# Patient Record
Sex: Male | Born: 1945
Health system: Southern US, Community
[De-identification: ages and names within clinical notes are randomized; demographics above are authoritative.]

## PROBLEM LIST (undated history)

## (undated) DIAGNOSIS — M1990 Unspecified osteoarthritis, unspecified site: Secondary | ICD-10-CM

## (undated) DIAGNOSIS — E669 Obesity, unspecified: Secondary | ICD-10-CM

## (undated) DIAGNOSIS — Z950 Presence of cardiac pacemaker: Secondary | ICD-10-CM

## (undated) DIAGNOSIS — J986 Disorders of diaphragm: Secondary | ICD-10-CM

## (undated) DIAGNOSIS — J45909 Unspecified asthma, uncomplicated: Secondary | ICD-10-CM

## (undated) DIAGNOSIS — J189 Pneumonia, unspecified organism: Secondary | ICD-10-CM

## (undated) DIAGNOSIS — R972 Elevated prostate specific antigen [PSA]: Secondary | ICD-10-CM

## (undated) DIAGNOSIS — I10 Essential (primary) hypertension: Secondary | ICD-10-CM

## (undated) DIAGNOSIS — W3400XA Accidental discharge from unspecified firearms or gun, initial encounter: Secondary | ICD-10-CM

## (undated) DIAGNOSIS — I2510 Atherosclerotic heart disease of native coronary artery without angina pectoris: Secondary | ICD-10-CM

## (undated) DIAGNOSIS — H9311 Tinnitus, right ear: Secondary | ICD-10-CM

## (undated) DIAGNOSIS — S21339A Puncture wound without foreign body of unspecified front wall of thorax with penetration into thoracic cavity, initial encounter: Secondary | ICD-10-CM

## (undated) DIAGNOSIS — N182 Chronic kidney disease, stage 2 (mild): Secondary | ICD-10-CM

## (undated) DIAGNOSIS — L300 Nummular dermatitis: Secondary | ICD-10-CM

## (undated) DIAGNOSIS — H9190 Unspecified hearing loss, unspecified ear: Secondary | ICD-10-CM

## (undated) DIAGNOSIS — A770 Spotted fever due to Rickettsia rickettsii: Secondary | ICD-10-CM

## (undated) DIAGNOSIS — L719 Rosacea, unspecified: Secondary | ICD-10-CM

## (undated) DIAGNOSIS — S32009A Unspecified fracture of unspecified lumbar vertebra, initial encounter for closed fracture: Secondary | ICD-10-CM

## (undated) HISTORY — DX: Rosacea, unspecified: L71.9

## (undated) HISTORY — PX: TENDON REPAIR: SHX5111

## (undated) HISTORY — DX: Unspecified hearing loss, unspecified ear: H91.90

## (undated) HISTORY — DX: Obesity, unspecified: E66.9

## (undated) HISTORY — DX: Elevated prostate specific antigen (PSA): R97.20

## (undated) HISTORY — DX: Nummular dermatitis: L30.0

## (undated) HISTORY — DX: Disorders of diaphragm: J98.6

---

## 1983-03-29 DIAGNOSIS — S21339A Puncture wound without foreign body of unspecified front wall of thorax with penetration into thoracic cavity, initial encounter: Secondary | ICD-10-CM

## 1983-03-29 HISTORY — DX: Puncture wound without foreign body of unspecified front wall of thorax with penetration into thoracic cavity, initial encounter: S21.339A

## 2002-12-27 ENCOUNTER — Emergency Department (HOSPITAL_COMMUNITY): Admission: EM | Admit: 2002-12-27 | Discharge: 2002-12-27 | Payer: Self-pay | Admitting: Emergency Medicine

## 2002-12-27 ENCOUNTER — Encounter: Payer: Self-pay | Admitting: *Deleted

## 2008-04-16 ENCOUNTER — Encounter: Admission: RE | Admit: 2008-04-16 | Discharge: 2008-04-16 | Payer: Self-pay | Admitting: Geriatric Medicine

## 2010-04-18 ENCOUNTER — Encounter: Payer: Self-pay | Admitting: Geriatric Medicine

## 2010-06-08 ENCOUNTER — Emergency Department (HOSPITAL_COMMUNITY): Payer: BC Managed Care – PPO

## 2010-06-08 ENCOUNTER — Emergency Department (HOSPITAL_COMMUNITY)
Admission: EM | Admit: 2010-06-08 | Discharge: 2010-06-09 | Disposition: A | Discharge status: Home / Self Care | Payer: BC Managed Care – PPO | Attending: Emergency Medicine | Admitting: Emergency Medicine

## 2010-06-08 DIAGNOSIS — S298XXA Other specified injuries of thorax, initial encounter: Secondary | ICD-10-CM | POA: Insufficient documentation

## 2010-06-08 DIAGNOSIS — R0609 Other forms of dyspnea: Secondary | ICD-10-CM | POA: Insufficient documentation

## 2010-06-08 DIAGNOSIS — R079 Chest pain, unspecified: Secondary | ICD-10-CM | POA: Insufficient documentation

## 2010-06-08 DIAGNOSIS — R0989 Other specified symptoms and signs involving the circulatory and respiratory systems: Secondary | ICD-10-CM | POA: Insufficient documentation

## 2010-06-08 DIAGNOSIS — R062 Wheezing: Secondary | ICD-10-CM | POA: Insufficient documentation

## 2010-06-08 DIAGNOSIS — R509 Fever, unspecified: Secondary | ICD-10-CM | POA: Insufficient documentation

## 2010-06-08 DIAGNOSIS — I10 Essential (primary) hypertension: Secondary | ICD-10-CM | POA: Insufficient documentation

## 2010-06-08 DIAGNOSIS — Z79899 Other long term (current) drug therapy: Secondary | ICD-10-CM | POA: Insufficient documentation

## 2010-06-08 DIAGNOSIS — R0602 Shortness of breath: Secondary | ICD-10-CM | POA: Insufficient documentation

## 2010-06-08 DIAGNOSIS — J189 Pneumonia, unspecified organism: Secondary | ICD-10-CM | POA: Insufficient documentation

## 2010-06-08 DIAGNOSIS — Y9229 Other specified public building as the place of occurrence of the external cause: Secondary | ICD-10-CM | POA: Insufficient documentation

## 2010-06-08 DIAGNOSIS — R42 Dizziness and giddiness: Secondary | ICD-10-CM | POA: Insufficient documentation

## 2010-06-08 DIAGNOSIS — R000 Tachycardia, unspecified: Secondary | ICD-10-CM | POA: Insufficient documentation

## 2010-06-08 LAB — COMPREHENSIVE METABOLIC PANEL
ALT: 33 U/L (ref 0–53)
AST: 23 U/L (ref 0–37)
Albumin: 3.9 g/dL (ref 3.5–5.2)
Alkaline Phosphatase: 52 U/L (ref 39–117)
BUN: 14 mg/dL (ref 6–23)
CO2: 23 mEq/L (ref 19–32)
Calcium: 8.7 mg/dL (ref 8.4–10.5)
Chloride: 106 mEq/L (ref 96–112)
Creatinine, Ser: 1.02 mg/dL (ref 0.4–1.5)
GFR calc Af Amer: >60 mL/min (ref >60)
GFR calc non Af Amer: >60 mL/min (ref >60)
Glucose, Bld: 115 mg/dL — ABNORMAL HIGH (ref 70–99)
Potassium: 3.9 mEq/L (ref 3.5–5.1)
Sodium: 138 mEq/L (ref 135–145)
Total Bilirubin: 1.2 mg/dL (ref 0.3–1.2)
Total Protein: 7.3 g/dL (ref 6.0–8.3)

## 2010-06-08 LAB — DIFFERENTIAL
Basophils Absolute: 0 10*3/uL (ref 0.0–0.1)
Basophils Relative: 0 % (ref 0–1)
Eosinophils Absolute: 0 10*3/uL (ref 0.0–0.7)
Eosinophils Relative: 0 % (ref 0–5)
Lymphocytes Relative: 11 % — ABNORMAL LOW (ref 12–46)
Lymphs Abs: 1.5 10*3/uL (ref 0.7–4.0)
Monocytes Absolute: 1.3 10*3/uL — ABNORMAL HIGH (ref 0.1–1.0)
Monocytes Relative: 10 % (ref 3–12)
Neutro Abs: 11 10*3/uL — ABNORMAL HIGH (ref 1.7–7.7)
Neutrophils Relative %: 80 % — ABNORMAL HIGH (ref 43–77)

## 2010-06-08 LAB — POCT CARDIAC MARKERS
CKMB, poc: <1 ng/mL — ABNORMAL LOW (ref 1.0–8.0)
Myoglobin, poc: 94.5 ng/mL (ref 12–200)
Troponin i, poc: <0.05 ng/mL (ref 0.00–0.09)

## 2010-06-08 LAB — CBC
HCT: 41.2 % (ref 39.0–52.0)
Hemoglobin: 14.1 g/dL (ref 13.0–17.0)
MCH: 30.4 pg (ref 26.0–34.0)
MCHC: 34.2 g/dL (ref 30.0–36.0)
MCV: 88.8 fL (ref 78.0–100.0)
Platelets: 197 10*3/uL (ref 150–400)
RBC: 4.64 MIL/uL (ref 4.22–5.81)
RDW: 13.3 % (ref 11.5–15.5)
WBC: 13.8 10*3/uL — ABNORMAL HIGH (ref 4.0–10.5)

## 2010-06-08 LAB — D-DIMER, QUANTITATIVE: D-Dimer, Quant: 0.33 ug/mL-FEU (ref 0.00–0.48)

## 2010-06-09 MED ORDER — IOHEXOL 300 MG/ML  SOLN
100.0000 mL | Freq: Once | INTRAMUSCULAR | Status: AC | PRN
  Administered 2010-06-09: 100 mL via INTRAVENOUS

## 2013-01-08 ENCOUNTER — Other Ambulatory Visit: Payer: Self-pay | Admitting: Geriatric Medicine

## 2013-01-08 ENCOUNTER — Ambulatory Visit
Admission: RE | Admit: 2013-01-08 | Discharge: 2013-01-08 | Disposition: A | Discharge status: Home / Self Care | Payer: Medicare Other | Source: Ambulatory Visit | Attending: Geriatric Medicine | Admitting: Geriatric Medicine

## 2013-01-08 DIAGNOSIS — M549 Dorsalgia, unspecified: Secondary | ICD-10-CM

## 2013-09-18 ENCOUNTER — Encounter: Payer: Self-pay | Admitting: Cardiology

## 2013-09-18 DIAGNOSIS — I10 Essential (primary) hypertension: Secondary | ICD-10-CM | POA: Insufficient documentation

## 2013-09-18 DIAGNOSIS — H6990 Unspecified Eustachian tube disorder, unspecified ear: Secondary | ICD-10-CM

## 2013-09-24 ENCOUNTER — Other Ambulatory Visit (HOSPITAL_COMMUNITY): Payer: Medicare Other

## 2013-11-14 ENCOUNTER — Other Ambulatory Visit (HOSPITAL_COMMUNITY): Payer: Self-pay | Admitting: Geriatric Medicine

## 2013-11-14 DIAGNOSIS — I4510 Unspecified right bundle-branch block: Secondary | ICD-10-CM

## 2013-11-15 ENCOUNTER — Other Ambulatory Visit (HOSPITAL_COMMUNITY): Payer: Medicare Other

## 2013-11-18 ENCOUNTER — Encounter (HOSPITAL_COMMUNITY): Payer: Self-pay | Admitting: Geriatric Medicine

## 2015-07-01 DIAGNOSIS — R05 Cough: Secondary | ICD-10-CM | POA: Diagnosis not present

## 2015-07-02 ENCOUNTER — Other Ambulatory Visit: Payer: Self-pay | Admitting: Geriatric Medicine

## 2015-07-02 ENCOUNTER — Ambulatory Visit
Admission: RE | Admit: 2015-07-02 | Discharge: 2015-07-02 | Disposition: A | Discharge status: Home / Self Care | Payer: Medicare Other | Source: Ambulatory Visit | Attending: Geriatric Medicine | Admitting: Geriatric Medicine

## 2015-07-02 DIAGNOSIS — R0602 Shortness of breath: Secondary | ICD-10-CM | POA: Diagnosis not present

## 2015-07-02 DIAGNOSIS — R059 Cough, unspecified: Secondary | ICD-10-CM

## 2015-07-02 DIAGNOSIS — R05 Cough: Secondary | ICD-10-CM

## 2015-08-30 DIAGNOSIS — I10 Essential (primary) hypertension: Secondary | ICD-10-CM | POA: Diagnosis not present

## 2015-08-30 DIAGNOSIS — R0600 Dyspnea, unspecified: Secondary | ICD-10-CM | POA: Diagnosis not present

## 2016-03-02 DIAGNOSIS — Z125 Encounter for screening for malignant neoplasm of prostate: Secondary | ICD-10-CM | POA: Diagnosis not present

## 2016-03-02 DIAGNOSIS — Z1389 Encounter for screening for other disorder: Secondary | ICD-10-CM | POA: Diagnosis not present

## 2016-03-02 DIAGNOSIS — R05 Cough: Secondary | ICD-10-CM | POA: Diagnosis not present

## 2016-03-02 DIAGNOSIS — I10 Essential (primary) hypertension: Secondary | ICD-10-CM | POA: Diagnosis not present

## 2016-03-02 DIAGNOSIS — Z6833 Body mass index (BMI) 33.0-33.9, adult: Secondary | ICD-10-CM | POA: Diagnosis not present

## 2016-03-02 DIAGNOSIS — Z79899 Other long term (current) drug therapy: Secondary | ICD-10-CM | POA: Diagnosis not present

## 2016-03-02 DIAGNOSIS — E669 Obesity, unspecified: Secondary | ICD-10-CM | POA: Diagnosis not present

## 2016-03-02 DIAGNOSIS — Z0000 Encounter for general adult medical examination without abnormal findings: Secondary | ICD-10-CM | POA: Diagnosis not present

## 2016-04-23 DIAGNOSIS — H40033 Anatomical narrow angle, bilateral: Secondary | ICD-10-CM | POA: Diagnosis not present

## 2016-04-23 DIAGNOSIS — H2513 Age-related nuclear cataract, bilateral: Secondary | ICD-10-CM | POA: Diagnosis not present

## 2016-04-29 ENCOUNTER — Ambulatory Visit
Admission: RE | Admit: 2016-04-29 | Discharge: 2016-04-29 | Disposition: A | Discharge status: Home / Self Care | Payer: Medicare Other | Source: Ambulatory Visit | Attending: Nurse Practitioner | Admitting: Nurse Practitioner

## 2016-04-29 ENCOUNTER — Other Ambulatory Visit: Payer: Self-pay | Admitting: Nurse Practitioner

## 2016-04-29 DIAGNOSIS — R509 Fever, unspecified: Secondary | ICD-10-CM

## 2016-04-29 DIAGNOSIS — R05 Cough: Secondary | ICD-10-CM | POA: Diagnosis not present

## 2016-04-29 DIAGNOSIS — R062 Wheezing: Secondary | ICD-10-CM | POA: Diagnosis not present

## 2016-04-29 DIAGNOSIS — R0989 Other specified symptoms and signs involving the circulatory and respiratory systems: Secondary | ICD-10-CM | POA: Diagnosis not present

## 2016-05-09 DIAGNOSIS — I10 Essential (primary) hypertension: Secondary | ICD-10-CM | POA: Diagnosis not present

## 2016-05-09 DIAGNOSIS — R0982 Postnasal drip: Secondary | ICD-10-CM | POA: Diagnosis not present

## 2016-09-07 DIAGNOSIS — G4730 Sleep apnea, unspecified: Secondary | ICD-10-CM | POA: Diagnosis not present

## 2016-09-07 DIAGNOSIS — Z79899 Other long term (current) drug therapy: Secondary | ICD-10-CM | POA: Diagnosis not present

## 2016-09-07 DIAGNOSIS — I10 Essential (primary) hypertension: Secondary | ICD-10-CM | POA: Diagnosis not present

## 2016-09-07 DIAGNOSIS — E669 Obesity, unspecified: Secondary | ICD-10-CM | POA: Diagnosis not present

## 2016-11-08 DIAGNOSIS — D225 Melanocytic nevi of trunk: Secondary | ICD-10-CM | POA: Diagnosis not present

## 2016-11-08 DIAGNOSIS — L821 Other seborrheic keratosis: Secondary | ICD-10-CM | POA: Diagnosis not present

## 2016-11-08 DIAGNOSIS — B354 Tinea corporis: Secondary | ICD-10-CM | POA: Diagnosis not present

## 2016-12-20 DIAGNOSIS — H18413 Arcus senilis, bilateral: Secondary | ICD-10-CM | POA: Diagnosis not present

## 2016-12-20 DIAGNOSIS — H25013 Cortical age-related cataract, bilateral: Secondary | ICD-10-CM | POA: Diagnosis not present

## 2016-12-20 DIAGNOSIS — H2513 Age-related nuclear cataract, bilateral: Secondary | ICD-10-CM | POA: Diagnosis not present

## 2016-12-20 DIAGNOSIS — H25043 Posterior subcapsular polar age-related cataract, bilateral: Secondary | ICD-10-CM | POA: Diagnosis not present

## 2017-01-05 DIAGNOSIS — L309 Dermatitis, unspecified: Secondary | ICD-10-CM | POA: Diagnosis not present

## 2017-03-09 DIAGNOSIS — Z0000 Encounter for general adult medical examination without abnormal findings: Secondary | ICD-10-CM | POA: Diagnosis not present

## 2017-03-09 DIAGNOSIS — N183 Chronic kidney disease, stage 3 (moderate): Secondary | ICD-10-CM | POA: Diagnosis not present

## 2017-03-09 DIAGNOSIS — G629 Polyneuropathy, unspecified: Secondary | ICD-10-CM | POA: Diagnosis not present

## 2017-03-09 DIAGNOSIS — E782 Mixed hyperlipidemia: Secondary | ICD-10-CM | POA: Diagnosis not present

## 2017-03-10 ENCOUNTER — Other Ambulatory Visit: Payer: Self-pay | Admitting: Geriatric Medicine

## 2017-03-10 DIAGNOSIS — R0600 Dyspnea, unspecified: Secondary | ICD-10-CM

## 2017-03-10 DIAGNOSIS — R0609 Other forms of dyspnea: Principal | ICD-10-CM

## 2017-03-14 ENCOUNTER — Encounter (HOSPITAL_COMMUNITY): Payer: Medicare Other

## 2017-03-15 ENCOUNTER — Ambulatory Visit
Admission: RE | Admit: 2017-03-15 | Discharge: 2017-03-15 | Disposition: A | Discharge status: Home / Self Care | Payer: Medicare Other | Source: Ambulatory Visit | Attending: Geriatric Medicine | Admitting: Geriatric Medicine

## 2017-03-15 ENCOUNTER — Other Ambulatory Visit: Payer: Self-pay | Admitting: Geriatric Medicine

## 2017-03-15 DIAGNOSIS — R0600 Dyspnea, unspecified: Secondary | ICD-10-CM

## 2017-03-15 DIAGNOSIS — R05 Cough: Secondary | ICD-10-CM | POA: Diagnosis not present

## 2017-03-15 DIAGNOSIS — R0609 Other forms of dyspnea: Principal | ICD-10-CM

## 2017-03-30 ENCOUNTER — Encounter (HOSPITAL_COMMUNITY): Payer: Medicare Other

## 2017-04-07 ENCOUNTER — Telehealth (HOSPITAL_COMMUNITY): Payer: Self-pay | Admitting: Geriatric Medicine

## 2017-04-07 NOTE — Telephone Encounter (Signed)
User: Trina AoGriffin, [name] A Date/time: 04/04/2017 10:17 AM  Comment: Called pt and lmsg for him to CB ro r/s [email or web address]  Context: [place] Schedule Orders/Appt Requests Outcome: Left Message  Phone number: [phone] Phone Type: Home Phone  Comm. type: Telephone Call type: Outgoing  Contact: Brian [name], Brian [name] Relation to patient: Self  Letter:      User: Trina AoGriffin, [name] A Date/time: 03/31/2017 11:45 AM  Comment: Called pt and lmsg for him to CB to r/s [email or web address]  Context: [place] Schedule Orders/Appt Requests Outcome: Left Message  Phone number: [phone] Phone Type: Home Phone  Comm. type: Telephone Call type: Outgoing  Contact: Brian [name], Brian [name] Relation to patient: Self  Letter:      User: Trina AoGriffin, [name] A Date/time: 03/27/2017 2:25 PM  Comment: Called pt and lmsg for him to CB to r/s [email or web address]  Context: [place] Schedule Orders/Appt Requests Outcome: Left Message  Phone number: [phone] Phone Type: Home Phone  Comm. type: Telephone Call type: Outgoing  Contact: Brian [name], Brian [name] Relation to patient: Self  Letter:

## 2017-04-19 DIAGNOSIS — D3131 Benign neoplasm of right choroid: Secondary | ICD-10-CM | POA: Diagnosis not present

## 2017-04-19 DIAGNOSIS — H353133 Nonexudative age-related macular degeneration, bilateral, advanced atrophic without subfoveal involvement: Secondary | ICD-10-CM | POA: Diagnosis not present

## 2017-04-19 DIAGNOSIS — H43813 Vitreous degeneration, bilateral: Secondary | ICD-10-CM | POA: Diagnosis not present

## 2017-04-19 DIAGNOSIS — H2513 Age-related nuclear cataract, bilateral: Secondary | ICD-10-CM | POA: Diagnosis not present

## 2017-06-14 DIAGNOSIS — Z1211 Encounter for screening for malignant neoplasm of colon: Secondary | ICD-10-CM | POA: Diagnosis not present

## 2017-06-20 DIAGNOSIS — R972 Elevated prostate specific antigen [PSA]: Secondary | ICD-10-CM | POA: Diagnosis not present

## 2017-06-20 DIAGNOSIS — R351 Nocturia: Secondary | ICD-10-CM | POA: Diagnosis not present

## 2017-07-18 DIAGNOSIS — H25013 Cortical age-related cataract, bilateral: Secondary | ICD-10-CM | POA: Diagnosis not present

## 2017-07-18 DIAGNOSIS — H25043 Posterior subcapsular polar age-related cataract, bilateral: Secondary | ICD-10-CM | POA: Diagnosis not present

## 2017-07-18 DIAGNOSIS — H2513 Age-related nuclear cataract, bilateral: Secondary | ICD-10-CM | POA: Diagnosis not present

## 2017-07-18 DIAGNOSIS — H18413 Arcus senilis, bilateral: Secondary | ICD-10-CM | POA: Diagnosis not present

## 2017-09-07 ENCOUNTER — Encounter: Payer: Self-pay | Admitting: Internal Medicine

## 2017-09-07 ENCOUNTER — Other Ambulatory Visit: Payer: Self-pay

## 2017-09-07 ENCOUNTER — Encounter (INDEPENDENT_AMBULATORY_CARE_PROVIDER_SITE_OTHER): Payer: Self-pay

## 2017-09-07 ENCOUNTER — Telehealth: Payer: Self-pay | Admitting: Cardiology

## 2017-09-07 ENCOUNTER — Ambulatory Visit (INDEPENDENT_AMBULATORY_CARE_PROVIDER_SITE_OTHER): Payer: Medicare Other | Admitting: Internal Medicine

## 2017-09-07 ENCOUNTER — Ambulatory Visit (HOSPITAL_COMMUNITY): Payer: Medicare Other | Attending: Cardiology

## 2017-09-07 VITALS — BP 160/50 | HR 37 | Ht 70.0 in | Wt 216.0 lb

## 2017-09-07 DIAGNOSIS — I119 Hypertensive heart disease without heart failure: Secondary | ICD-10-CM | POA: Insufficient documentation

## 2017-09-07 DIAGNOSIS — E669 Obesity, unspecified: Secondary | ICD-10-CM | POA: Diagnosis not present

## 2017-09-07 DIAGNOSIS — Z6831 Body mass index (BMI) 31.0-31.9, adult: Secondary | ICD-10-CM | POA: Diagnosis not present

## 2017-09-07 DIAGNOSIS — R001 Bradycardia, unspecified: Secondary | ICD-10-CM | POA: Diagnosis not present

## 2017-09-07 DIAGNOSIS — I441 Atrioventricular block, second degree: Secondary | ICD-10-CM

## 2017-09-07 DIAGNOSIS — E785 Hyperlipidemia, unspecified: Secondary | ICD-10-CM | POA: Insufficient documentation

## 2017-09-07 DIAGNOSIS — I129 Hypertensive chronic kidney disease with stage 1 through stage 4 chronic kidney disease, or unspecified chronic kidney disease: Secondary | ICD-10-CM | POA: Diagnosis not present

## 2017-09-07 DIAGNOSIS — B372 Candidiasis of skin and nail: Secondary | ICD-10-CM | POA: Diagnosis not present

## 2017-09-07 DIAGNOSIS — N183 Chronic kidney disease, stage 3 (moderate): Secondary | ICD-10-CM | POA: Diagnosis not present

## 2017-09-07 LAB — ECHOCARDIOGRAM COMPLETE
Height: 70 in
Weight: 3456 oz

## 2017-09-07 NOTE — Patient Instructions (Signed)
Medication Instructions:  Your physician recommends that you continue on your current medications as directed. Please refer to the Current Medication list given to you today.  Labwork: You will get lab work today:  BMP and CBC.  Testing/Procedures: Your physician has recommended that you have a pacemaker inserted. A pacemaker is a small device that is placed under the skin of your chest or abdomen to help control abnormal heart rhythms. This device uses electrical pulses to prompt the heart to beat at a normal rate. Pacemakers are used to treat heart rhythms that are too slow. Wire (leads) are attached to the pacemaker that goes into the chambers of you heart. This is done in the hospital and usually requires and overnight stay. Please see the instruction sheet given to you today for more information.  Follow-Up: You will follow up with device clinic 10-14 days after your procedure for a wound check.  You will follow up with Dr. Ladona Ridgelaylor 91 days after your procedure.  Any Other Special Instructions Will Be Listed Below (If Applicable).  PACEMAKER INSTRUCTIONS:  [facility] at:  1:30 pm on 09/07/2017 You may have a light breakfast before 7:30 am.  No solid food after 7:30 am.  You may have small sips of water for dry mouth. You may take your normal morning medications day of procedure except for :  Lasix, spironolactone or aspirin. Plan for one night stay You will need someone to drive you home at discharge  If you need a refill on your cardiac medications before your next appointment, please call your pharmacy.   Pacemaker Implantation, Adult Pacemaker implantation is a procedure to place a pacemaker inside your chest. A pacemaker is a small computer that sends electrical signals to the heart and helps your heart beat normally. A pacemaker also stores information about your heart rhythms. You may need pacemaker implantation if  you:  Have a slow heartbeat (bradycardia).  Faint (syncope).  Have shortness of breath (dyspnea) due to heart problems.  The pacemaker attaches to your heart through a wire, called a lead. Sometimes just one lead is needed. Other times, there will be two leads. There are two types of pacemakers:  Transvenous pacemaker. This type is placed under the skin or muscle of your chest. The lead goes through a vein in the chest area to reach the inside of the heart.  Epicardial pacemaker. This type is placed under the skin or muscle of your chest or belly. The lead goes through your chest to the outside of the heart.  Tell a health care provider about:  Any allergies you have.  All medicines you are taking, including vitamins, herbs, eye drops, creams, and over-the-counter medicines.  Any problems you or family members have had with anesthetic medicines.  Any blood or bone disorders you have.  Any surgeries you have had.  Any medical conditions you have.  Whether you are pregnant or may be pregnant. What are the risks? Generally, this is a safe procedure. However, problems may occur, including:  Infection.  Bleeding.  Failure of the pacemaker or the lead.  Collapse of a lung or bleeding into a lung.  Blood clot inside a blood vessel with a lead.  Damage to the heart.  Infection inside the heart (endocarditis).  Allergic reactions to medicines.  What happens before the procedure? Staying hydrated Follow instructions from your health care provider about hydration, which may include:  Up to 2  hours before the procedure - you may continue to drink clear liquids, such as water, clear fruit juice, black coffee, and plain tea.  Eating and drinking restrictions Follow instructions from your health care provider about eating and drinking, which may include:  8 hours before the procedure - stop eating heavy meals or foods such as meat, fried foods, or fatty foods.  6 hours  before the procedure - stop eating light meals or foods, such as toast or cereal.  6 hours before the procedure - stop drinking milk or drinks that contain milk.  2 hours before the procedure - stop drinking clear liquids.  Medicines  Ask your health care provider about: ? Changing or stopping your regular medicines. This is especially important if you are taking diabetes medicines or blood thinners. ? Taking medicines such as aspirin and ibuprofen. These medicines can thin your blood. Do not take these medicines before your procedure if your health care provider instructs you not to.  You may be given antibiotic medicine to help prevent infection. General instructions  You will have a heart evaluation. This may include an electrocardiogram (ECG), chest X-ray, and heart imaging (echocardiogram,  or echo) tests.  You will have blood tests.  Do not use any products that contain nicotine or tobacco, such as cigarettes and e-cigarettes. If you need help quitting, ask your health care provider.  Plan to have someone take you home from the hospital or clinic.  If you will be going home right after the procedure, plan to have someone with you for 24 hours.  Ask your health care provider how your surgical site will be marked or identified. What happens during the procedure?  To reduce your risk of infection: ? Your health care team will wash or sanitize their hands. ? Your skin will be washed with soap. ? Hair may be removed from the surgical area.  An IV tube will be inserted into one of your veins.  You will be given one or more of the following: ? A medicine to help you relax (sedative). ? A medicine to numb the area (local anesthetic). ? A medicine to make you fall asleep (general anesthetic).  If you are getting a transvenous pacemaker: ? An incision will be made in your upper chest. ? A pocket will be made for the pacemaker. It may be placed under the skin or between layers of  muscle. ? The lead will be inserted into a blood vessel that returns to the heart. ? While X-rays are taken by an imaging machine (fluoroscopy), the lead will be advanced through the vein to the inside of your heart. ? The other end of the lead will be tunneled under the skin and attached to the pacemaker.  If you are getting an epicardial pacemaker: ? An incision will be made near your ribs or breastbone (sternum) for the lead. ? The lead will be attached to the outside of your heart. ? Another incision will be made in your chest or upper belly to create a pocket for the pacemaker. ? The free end of the lead will be tunneled under the skin and attached to the pacemaker.  The transvenous or epicardial pacemaker will be tested. Imaging studies may be done to check the lead position.  The incisions will be closed with stitches (sutures), adhesive strips, or skin glue.  Bandages (dressing) will be placed over the incisions. The procedure may vary among health care providers and hospitals. What happens after the procedure?  Your blood pressure, heart rate, breathing rate, and blood oxygen level will be monitored until the medicines you were given have worn off.  You will be given antibiotics and pain medicine.  ECG and chest x-rays will be done.  You will wear a continuous type of ECG (Holter monitor) to check your heart rhythm.  Your health care provider willprogram the pacemaker.  Do not drive for 24 hours if you received a sedative. This information is not intended to replace advice given to you by your health care provider. Make sure you discuss any questions you have with your health care provider. Document Released: 03/04/2002 Document Revised: 10/02/2015 Document Reviewed: 08/26/2015 Elsevier Interactive Patient Education  [employer].
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
## 2017-09-08 ENCOUNTER — Ambulatory Visit (HOSPITAL_COMMUNITY)
Admission: RE | Disposition: A | Discharge status: Home / Self Care | Payer: Self-pay | Source: Ambulatory Visit | Attending: Internal Medicine

## 2017-09-08 ENCOUNTER — Ambulatory Visit (HOSPITAL_COMMUNITY)
Admission: RE | Admit: 2017-09-08 | Discharge: 2017-09-09 | Disposition: A | Discharge status: Home / Self Care | Payer: Medicare Other | Source: Ambulatory Visit | Attending: Internal Medicine | Admitting: Internal Medicine

## 2017-09-08 ENCOUNTER — Encounter (HOSPITAL_COMMUNITY): Payer: Self-pay | Admitting: General Practice

## 2017-09-08 ENCOUNTER — Other Ambulatory Visit: Payer: Self-pay

## 2017-09-08 DIAGNOSIS — Z9889 Other specified postprocedural states: Secondary | ICD-10-CM | POA: Diagnosis not present

## 2017-09-08 DIAGNOSIS — L719 Rosacea, unspecified: Secondary | ICD-10-CM | POA: Insufficient documentation

## 2017-09-08 DIAGNOSIS — Z7982 Long term (current) use of aspirin: Secondary | ICD-10-CM | POA: Diagnosis not present

## 2017-09-08 DIAGNOSIS — Z87828 Personal history of other (healed) physical injury and trauma: Secondary | ICD-10-CM | POA: Insufficient documentation

## 2017-09-08 DIAGNOSIS — Z886 Allergy status to analgesic agent status: Secondary | ICD-10-CM | POA: Insufficient documentation

## 2017-09-08 DIAGNOSIS — Z6830 Body mass index (BMI) 30.0-30.9, adult: Secondary | ICD-10-CM | POA: Diagnosis not present

## 2017-09-08 DIAGNOSIS — Z888 Allergy status to other drugs, medicaments and biological substances status: Secondary | ICD-10-CM | POA: Diagnosis not present

## 2017-09-08 DIAGNOSIS — Z79899 Other long term (current) drug therapy: Secondary | ICD-10-CM | POA: Insufficient documentation

## 2017-09-08 DIAGNOSIS — H9190 Unspecified hearing loss, unspecified ear: Secondary | ICD-10-CM | POA: Insufficient documentation

## 2017-09-08 DIAGNOSIS — I452 Bifascicular block: Secondary | ICD-10-CM | POA: Insufficient documentation

## 2017-09-08 DIAGNOSIS — I10 Essential (primary) hypertension: Secondary | ICD-10-CM | POA: Insufficient documentation

## 2017-09-08 DIAGNOSIS — I442 Atrioventricular block, complete: Secondary | ICD-10-CM | POA: Diagnosis present

## 2017-09-08 DIAGNOSIS — J986 Disorders of diaphragm: Secondary | ICD-10-CM | POA: Insufficient documentation

## 2017-09-08 DIAGNOSIS — E669 Obesity, unspecified: Secondary | ICD-10-CM | POA: Diagnosis not present

## 2017-09-08 DIAGNOSIS — R0600 Dyspnea, unspecified: Secondary | ICD-10-CM | POA: Insufficient documentation

## 2017-09-08 DIAGNOSIS — R001 Bradycardia, unspecified: Secondary | ICD-10-CM | POA: Diagnosis not present

## 2017-09-08 DIAGNOSIS — Z885 Allergy status to narcotic agent status: Secondary | ICD-10-CM | POA: Insufficient documentation

## 2017-09-08 DIAGNOSIS — Z950 Presence of cardiac pacemaker: Secondary | ICD-10-CM | POA: Diagnosis not present

## 2017-09-08 HISTORY — DX: Unspecified fracture of unspecified lumbar vertebra, initial encounter for closed fracture: S32.009A

## 2017-09-08 HISTORY — DX: Unspecified osteoarthritis, unspecified site: M19.90

## 2017-09-08 HISTORY — DX: Unspecified asthma, uncomplicated: J45.909

## 2017-09-08 HISTORY — DX: Presence of cardiac pacemaker: Z95.0

## 2017-09-08 HISTORY — DX: Puncture wound without foreign body of unspecified front wall of thorax with penetration into thoracic cavity, initial encounter: S21.339A

## 2017-09-08 HISTORY — DX: Spotted fever due to Rickettsia rickettsii: A77.0

## 2017-09-08 HISTORY — DX: Tinnitus, right ear: H93.11

## 2017-09-08 HISTORY — DX: Essential (primary) hypertension: I10

## 2017-09-08 HISTORY — DX: Accidental discharge from unspecified firearms or gun, initial encounter: W34.00XA

## 2017-09-08 HISTORY — DX: Pneumonia, unspecified organism: J18.9

## 2017-09-08 HISTORY — PX: INSERT / REPLACE / REMOVE PACEMAKER: SUR710

## 2017-09-08 HISTORY — PX: PACEMAKER IMPLANT: EP1218

## 2017-09-08 LAB — BASIC METABOLIC PANEL
BUN/Creatinine Ratio: 18 (ref 10–24)
BUN: 28 mg/dL — ABNORMAL HIGH (ref 8–27)
CO2: 22 mmol/L (ref 20–29)
Calcium: 9.2 mg/dL (ref 8.6–10.2)
Chloride: 102 mmol/L (ref 96–106)
Creatinine, Ser: 1.59 mg/dL — ABNORMAL HIGH (ref 0.76–1.27)
GFR calc Af Amer: 49 mL/min/{1.73_m2} — ABNORMAL LOW (ref >59)
GFR calc non Af Amer: 43 mL/min/{1.73_m2} — ABNORMAL LOW (ref >59)
Glucose: 93 mg/dL (ref 65–99)
Potassium: 5.4 mmol/L — ABNORMAL HIGH (ref 3.5–5.2)
Sodium: 141 mmol/L (ref 134–144)

## 2017-09-08 LAB — CBC WITH DIFFERENTIAL/PLATELET
Basophils Absolute: 0 10*3/uL (ref 0.0–0.2)
Basos: 0 %
EOS (ABSOLUTE): 0.2 10*3/uL (ref 0.0–0.4)
Eos: 2 %
Hematocrit: 42 % (ref 37.5–51.0)
Hemoglobin: 13.9 g/dL (ref 13.0–17.7)
Immature Grans (Abs): 0 10*3/uL (ref 0.0–0.1)
Immature Granulocytes: 0 %
Lymphocytes Absolute: 1.3 10*3/uL (ref 0.7–3.1)
Lymphs: 17 %
MCH: 30.6 pg (ref 26.6–33.0)
MCHC: 33.1 g/dL (ref 31.5–35.7)
MCV: 93 fL (ref 79–97)
Monocytes Absolute: 0.9 10*3/uL (ref 0.1–0.9)
Monocytes: 11 %
Neutrophils Absolute: 5.5 10*3/uL (ref 1.4–7.0)
Neutrophils: 70 %
Platelets: 222 10*3/uL (ref 150–450)
RBC: 4.54 x10E6/uL (ref 4.14–5.80)
RDW: 13.8 % (ref 12.3–15.4)
WBC: 7.9 10*3/uL (ref 3.4–10.8)

## 2017-09-08 LAB — SURGICAL PCR SCREEN
MRSA, PCR: NEGATIVE
Staphylococcus aureus: POSITIVE — AB

## 2017-09-08 SURGERY — PACEMAKER IMPLANT

## 2017-09-08 MED ORDER — MUPIROCIN 2 % EX OINT
TOPICAL_OINTMENT | CUTANEOUS | Status: AC
  Administered 2017-09-08: 1 via NASAL
  Filled 2017-09-08: qty 22

## 2017-09-08 MED ORDER — SODIUM CHLORIDE 0.9 % IV SOLN
INTRAVENOUS | Status: DC | PRN
  Administered 2017-09-08: 17:00:00

## 2017-09-08 MED ORDER — FENTANYL CITRATE (PF) 100 MCG/2ML IJ SOLN
INTRAMUSCULAR | Status: AC
  Filled 2017-09-08: qty 2

## 2017-09-08 MED ORDER — GENTAMICIN SULFATE 40 MG/ML IJ SOLN
INTRAMUSCULAR | Status: AC
  Filled 2017-09-08: qty 2

## 2017-09-08 MED ORDER — HEPARIN (PORCINE) IN NACL 1000-0.9 UT/500ML-% IV SOLN
INTRAVENOUS | Status: AC
  Filled 2017-09-08: qty 500

## 2017-09-08 MED ORDER — ASPIRIN EC 81 MG PO TBEC
81.0000 mg | DELAYED_RELEASE_TABLET | Freq: Every day | ORAL | Status: DC
  Administered 2017-09-09: 09:00:00 81 mg via ORAL
  Filled 2017-09-08: qty 1

## 2017-09-08 MED ORDER — CEFAZOLIN SODIUM-DEXTROSE 2-4 GM/100ML-% IV SOLN
INTRAVENOUS | Status: AC
  Filled 2017-09-08: qty 100

## 2017-09-08 MED ORDER — CHLORHEXIDINE GLUCONATE 4 % EX LIQD
60.0000 mL | Freq: Once | CUTANEOUS | Status: DC
  Filled 2017-09-08: qty 60

## 2017-09-08 MED ORDER — CEFAZOLIN SODIUM-DEXTROSE 2-4 GM/100ML-% IV SOLN
2.0000 g | INTRAVENOUS | Status: AC
  Administered 2017-09-08: 2 g via INTRAVENOUS
  Filled 2017-09-08: qty 100

## 2017-09-08 MED ORDER — CEFAZOLIN SODIUM-DEXTROSE 1-4 GM/50ML-% IV SOLN
1.0000 g | Freq: Four times a day (QID) | INTRAVENOUS | Status: AC
  Administered 2017-09-08 – 2017-09-09 (×3): 1 g via INTRAVENOUS
  Filled 2017-09-08 (×3): qty 50

## 2017-09-08 MED ORDER — LIDOCAINE HCL (PF) 1 % IJ SOLN
INTRAMUSCULAR | Status: DC | PRN
  Administered 2017-09-08: 45 mL

## 2017-09-08 MED ORDER — MIDAZOLAM HCL 5 MG/5ML IJ SOLN
INTRAMUSCULAR | Status: AC
  Filled 2017-09-08: qty 5

## 2017-09-08 MED ORDER — FENTANYL CITRATE (PF) 100 MCG/2ML IJ SOLN
INTRAMUSCULAR | Status: DC | PRN
  Administered 2017-09-08 (×5): 12.5 ug via INTRAVENOUS

## 2017-09-08 MED ORDER — AMLODIPINE BESYLATE 10 MG PO TABS
10.0000 mg | ORAL_TABLET | Freq: Every day | ORAL | Status: DC
  Administered 2017-09-09: 09:00:00 10 mg via ORAL
  Filled 2017-09-08: qty 1

## 2017-09-08 MED ORDER — SODIUM CHLORIDE 0.9 % IV SOLN
INTRAVENOUS | Status: AC
  Filled 2017-09-08: qty 2

## 2017-09-08 MED ORDER — SPIRONOLACTONE 25 MG PO TABS
25.0000 mg | ORAL_TABLET | Freq: Every day | ORAL | Status: DC
  Filled 2017-09-08: qty 1

## 2017-09-08 MED ORDER — MIDAZOLAM HCL 5 MG/5ML IJ SOLN
INTRAMUSCULAR | Status: DC | PRN
  Administered 2017-09-08 (×5): 1 mg via INTRAVENOUS

## 2017-09-08 MED ORDER — LIDOCAINE HCL 1 % IJ SOLN
INTRAMUSCULAR | Status: AC
  Filled 2017-09-08: qty 60

## 2017-09-08 MED ORDER — HEPARIN (PORCINE) IN NACL 2-0.9 UNITS/ML
INTRAMUSCULAR | Status: AC | PRN
  Administered 2017-09-08: 1000 mL

## 2017-09-08 MED ORDER — LOSARTAN POTASSIUM 50 MG PO TABS
100.0000 mg | ORAL_TABLET | Freq: Every day | ORAL | Status: DC
  Administered 2017-09-09: 100 mg via ORAL
  Filled 2017-09-08: qty 2

## 2017-09-08 MED ORDER — ONDANSETRON HCL 4 MG/2ML IJ SOLN: 4.0000 mg | Freq: Four times a day (QID) | INTRAMUSCULAR | Status: DC | PRN

## 2017-09-08 MED ORDER — ACETAMINOPHEN 325 MG PO TABS
325.0000 mg | ORAL_TABLET | ORAL | Status: DC | PRN
  Administered 2017-09-08 – 2017-09-09 (×3): 650 mg via ORAL
  Filled 2017-09-08 (×3): qty 2

## 2017-09-08 MED ORDER — SODIUM CHLORIDE 0.9 % IV SOLN
80.0000 mg | INTRAVENOUS | Status: AC
  Administered 2017-09-08: 80 mg
  Filled 2017-09-08: qty 2

## 2017-09-08 MED ORDER — SODIUM CHLORIDE 0.9 % IV SOLN
INTRAVENOUS | Status: DC
  Administered 2017-09-08: 14:00:00 via INTRAVENOUS

## 2017-09-08 MED ORDER — FUROSEMIDE 40 MG PO TABS: 40.0000 mg | ORAL_TABLET | ORAL | Status: DC | PRN

## 2017-09-08 SURGICAL SUPPLY — 12 items
CABLE SURGICAL S-101-97-12 (CABLE) ×2 IMPLANT
CATH RIGHTSITE C315HIS02 (CATHETERS) ×2 IMPLANT
IPG PACE AZUR XT DR MRI W1DR01 (Pacemaker) ×1 IMPLANT
LEAD CAPSURE NOVUS 5076-52CM (Lead) ×2 IMPLANT
LEAD SELECT SECURE 3830 383069 (Lead) ×1 IMPLANT
PACE AZURE XT DR MRI W1DR01 (Pacemaker) ×2 IMPLANT
PAD DEFIB LIFELINK (PAD) ×2 IMPLANT
SELECT SECURE 3830 383069 (Lead) ×2 IMPLANT
SHEATH CLASSIC 7F (SHEATH) ×4 IMPLANT
SLITTER 6232ADJ (MISCELLANEOUS) ×2 IMPLANT
TRAY PACEMAKER INSERTION (PACKS) ×2 IMPLANT
WIRE HI TORQ VERSACORE-J 145CM (WIRE) ×2 IMPLANT
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
## 2017-09-09 ENCOUNTER — Encounter (HOSPITAL_COMMUNITY): Payer: Self-pay | Admitting: Cardiology

## 2017-09-09 ENCOUNTER — Ambulatory Visit (HOSPITAL_COMMUNITY): Payer: Medicare Other

## 2017-09-09 DIAGNOSIS — Z886 Allergy status to analgesic agent status: Secondary | ICD-10-CM | POA: Diagnosis not present

## 2017-09-09 DIAGNOSIS — Z950 Presence of cardiac pacemaker: Secondary | ICD-10-CM

## 2017-09-09 DIAGNOSIS — R0600 Dyspnea, unspecified: Secondary | ICD-10-CM | POA: Diagnosis not present

## 2017-09-09 DIAGNOSIS — Z6830 Body mass index (BMI) 30.0-30.9, adult: Secondary | ICD-10-CM | POA: Diagnosis not present

## 2017-09-09 DIAGNOSIS — Z7982 Long term (current) use of aspirin: Secondary | ICD-10-CM | POA: Diagnosis not present

## 2017-09-09 DIAGNOSIS — Z79899 Other long term (current) drug therapy: Secondary | ICD-10-CM | POA: Diagnosis not present

## 2017-09-09 DIAGNOSIS — Z888 Allergy status to other drugs, medicaments and biological substances status: Secondary | ICD-10-CM | POA: Diagnosis not present

## 2017-09-09 DIAGNOSIS — I442 Atrioventricular block, complete: Secondary | ICD-10-CM | POA: Diagnosis not present

## 2017-09-09 DIAGNOSIS — J986 Disorders of diaphragm: Secondary | ICD-10-CM | POA: Diagnosis not present

## 2017-09-09 DIAGNOSIS — E669 Obesity, unspecified: Secondary | ICD-10-CM | POA: Diagnosis not present

## 2017-09-09 DIAGNOSIS — L719 Rosacea, unspecified: Secondary | ICD-10-CM | POA: Diagnosis not present

## 2017-09-09 DIAGNOSIS — H9190 Unspecified hearing loss, unspecified ear: Secondary | ICD-10-CM | POA: Diagnosis not present

## 2017-09-09 DIAGNOSIS — R001 Bradycardia, unspecified: Secondary | ICD-10-CM | POA: Diagnosis not present

## 2017-09-09 DIAGNOSIS — I452 Bifascicular block: Secondary | ICD-10-CM | POA: Diagnosis not present

## 2017-09-09 DIAGNOSIS — Z87828 Personal history of other (healed) physical injury and trauma: Secondary | ICD-10-CM | POA: Diagnosis not present

## 2017-09-09 DIAGNOSIS — I10 Essential (primary) hypertension: Secondary | ICD-10-CM | POA: Diagnosis not present

## 2017-09-09 DIAGNOSIS — Z885 Allergy status to narcotic agent status: Secondary | ICD-10-CM | POA: Diagnosis not present

## 2017-09-09 HISTORY — DX: Presence of cardiac pacemaker: Z95.0

## 2017-09-09 MED ORDER — MUPIROCIN 2 % EX OINT
1.0000 "application " | TOPICAL_OINTMENT | Freq: Two times a day (BID) | CUTANEOUS | Status: DC
  Administered 2017-09-08 – 2017-09-09 (×2): 1 via NASAL

## 2017-09-09 MED ORDER — ACETAMINOPHEN 325 MG PO TABS: 325.0000 mg | ORAL_TABLET | ORAL | Status: DC | PRN

## 2017-09-09 MED ORDER — FUROSEMIDE 40 MG PO TABS
40.0000 mg | ORAL_TABLET | Freq: Every day | ORAL | Status: DC
  Administered 2017-09-09: 09:00:00 40 mg via ORAL
  Filled 2017-09-09: qty 1

## 2017-09-09 MED ORDER — CHLORHEXIDINE GLUCONATE CLOTH 2 % EX PADS
6.0000 | MEDICATED_PAD | Freq: Every day | CUTANEOUS | Status: DC
  Administered 2017-09-09: 6 via TOPICAL

## 2017-09-09 MED ORDER — YOU HAVE A PACEMAKER BOOK
Freq: Once | Status: AC
  Administered 2017-09-09: 04:00:00
  Filled 2017-09-09: qty 1

## 2017-09-09 MED ORDER — FUROSEMIDE 40 MG PO TABS: 40.0000 mg | ORAL_TABLET | Freq: Every day | ORAL | Status: DC

## 2017-09-09 MED ORDER — SPIRONOLACTONE 25 MG PO TABS: 12.5000 mg | ORAL_TABLET | Freq: Every day | ORAL | 3 refills | Status: DC

## 2017-09-09 NOTE — Discharge Summary (Addendum)
Discharge Summary    Patient ID: Brian [name],  MRN: [id number], DOB/AGE: 11/26/1945 72 y.o.  Admit date: 09/08/2017 Discharge date: 09/09/2017  Primary Care Provider: Merlene LaughterStoneking, Hal Primary Cardiologist: Sherryl MangesSteven Klein, MD  Discharge Diagnoses    Principal Problem:   Complete heart block [facility](HCC) Active Problems:   S/P placement of cardiac pacemaker 09/08/17 MDT   Essential hypertension, benign   Allergies Allergies  Allergen Reactions  . Percodan [Oxycodone-Aspirin]     Confusion   . Lisinopril Rash    Diagnostic Studies/Procedures    09/08/17  Conclusion   CONCLUSIONS:   1. Successful implantation of a Medtronic DDD dual-chamber pacemaker for symptomatic bradycardia due to CHB  2. No early apparent complications.       Echo 09/07/17 Study Conclusions  - Left ventricle: The cavity size was normal. Wall thickness was   normal. Systolic function was normal. The estimated ejection   fraction was in the range of 55% to 60%. Wall motion was normal;   there were no regional wall motion abnormalities. Features are   consistent with a pseudonormal left ventricular filling pattern,   with concomitant abnormal relaxation and increased filling   pressure (grade 2 diastolic dysfunction). - Left atrium: The atrium was mildly dilated. - Pulmonary arteries: Systolic pressure was moderately increased.   PA peak pressure: 45 mm Hg (S).  _____________   History of Present Illness     [age] yoM with recent -over last 2-3 months pt with exhaustion after work, much more than expected.  Some SOB as well.  Some peripheral edema, no chest pain.  No syncope no palpitations.  He was seen in office by Dr. Graciela HusbandsKlein, Echo was stable and with symptomatic bradycardia, PPM insertion was arranged.  Pt presented on 09/08/17.   Hospital Course     Consultants: none   PPM was placed without complications by Dr. Ladona Ridgelaylor.     Today 09/09/17 CXR was without pneumothorax -continued chronic elevation  of Lt hemidiaphragm.   Pacemaker has been interrogated and numbers are good.  Pt feels better already with improved HR.  EKG SR with V pacing and occ PVC.  + Lt shoulder pain from device.  Pt has been seen and found stable for discharge by Dr. Johney FrameAllred.  K+ is elevated now on spironolactone ? Lower dose will defer to Dr. Johney FrameAllred.  Decreased aldactone to 12.5 mg daily.  _____________  Discharge Vitals Blood pressure (!) 161/82, pulse 73, temperature 97.6 F (36.4 C), temperature source Oral, resp. rate 15, height 5\' 11"  (1.803 m), weight 218 lb 4.1 oz (99 kg), SpO2 98 %.  Filed Weights   09/08/17 1322 09/09/17 0402  Weight: 216 lb (98 kg) 218 lb 4.1 oz (99 kg)   General:Pleasant affect, NAD Skin:Warm and dry, brisk capillary refill HEENT:normocephalic, sclera clear, mucus membranes moist Neck:supple, no JVD, no bruits  Heart:S1S2 RRR with soft systolic murmur, no gallup, rub or click Lungs:clear without rales, rhonchi, or wheezes WUJ:WJXBAbd:soft, non tender, + BS, do not palpate liver spleen or masses Ext:no lower ext edema,  2+ radial pulses Neuro:alert and oriented X 3, MAE, follows commands, + facial symmetry Lt chest pacer site without hematoma, light dressing intact.   Labs & Radiologic Studies    CBC Recent Labs    09/07/17 1440  WBC 7.9  NEUTROABS 5.5  HGB 13.9  HCT 42.0  MCV 93  PLT 222   Basic Metabolic Panel Recent Labs    14/78/2906/13/19 1440  NA 141  K 5.4*  CL 102  CO2 22  GLUCOSE 93  BUN 28*  CREATININE 1.59*  CALCIUM 9.2   Liver Function Tests No results for input(s): AST, ALT, ALKPHOS, BILITOT, PROT, ALBUMIN in the last 72 hours. No results for input(s): LIPASE, AMYLASE in the last 72 hours. Cardiac Enzymes No results for input(s): CKTOTAL, CKMB, CKMBINDEX, TROPONINI in the last 72 hours. BNP Invalid input(s): POCBNP D-Dimer No results for input(s): DDIMER in the last 72 hours. Hemoglobin A1C No results for input(s): HGBA1C in the last 72 hours. Fasting Lipid  Panel No results for input(s): CHOL, HDL, LDLCALC, TRIG, CHOLHDL, LDLDIRECT in the last 72 hours. Thyroid Function Tests No results for input(s): TSH, T4TOTAL, T3FREE, THYROIDAB in the last 72 hours.  Invalid input(s): FREET3 _____________  Dg Chest 2 View  Result Date: 09/09/2017 CLINICAL DATA:  Pacemaker placement EXAM: CHEST - 2 VIEW COMPARISON:  03/15/2017 FINDINGS: Interval placement of left pacer with leads in the right atrium and likely in the right ventricle near the tricuspid valve. No visible pneumothorax. Marked elevation of the left hemidiaphragm. No confluent opacities or effusions. Heart is likely mildly enlarged. IMPRESSION: Left pacer placement as above.  No pneumothorax. Elevation of the left hemidiaphragm. Electronically Signed   By: Charlett Nose M.D.   On: 09/09/2017 07:30   Disposition   Pt is being discharged home today in good condition.  Follow-up Plans & Appointments   Heart Healthy Diet  Call Dr. Lubertha Basque office for any questions about pacemaker or site.   Pacer instructions were given.   Follow-up Information    [facility] [address] [facility] Follow up on 09/21/2017.   Specialty:  Cardiology Why:  12:00PM, wound check visit Contact information: [address] [place] [address] [phone]       Marinus Maw, MD Follow up on 12/13/2017.   Specialty:  Cardiology Why:  2:00PM Contact information: [address] [place] [address] [phone]            Discharge Medications   Allergies as of 09/09/2017      Reactions   Percodan [oxycodone-aspirin]    Confusion    Lisinopril Rash      Medication List    TAKE these medications   acetaminophen 325 MG tablet Commonly known as:  TYLENOL Take 1-2 tablets (325-650 mg total) by mouth every 4 (four) hours as needed for mild pain or moderate pain (at pacer site).   amLODipine 10 MG tablet Commonly known as:  NORVASC Take 1 tablet by  mouth daily.   aspirin 81 MG tablet Take 81 mg by mouth daily.   furosemide 40 MG tablet Commonly known as:  LASIX Take 1 tablet (40 mg total) by mouth daily. What changed:    when to take this  reasons to take this   losartan 100 MG tablet Commonly known as:  COZAAR Take 100 mg by mouth daily.   MAGNESIUM-POTASSIUM PO Take 1 tablet by mouth daily.   spironolactone 25 MG tablet Commonly known as:  ALDACTONE Take 0.5 tablets (12.5 mg total) by mouth daily. What changed:  how much to take   VITAMIN C PO Take 1 tablet by mouth daily.        Acute coronary syndrome (MI, NSTEMI, STEMI, etc) this admission?: No.    Outstanding Labs/Studies   None  Duration of Discharge Encounter   Greater than 30 minutes including physician time.  Signed, Sharlotte Alamo, NP  09/09/2017, 10:13 AM   I have seen, examined the patient, and reviewed the above assessment and plan.  Changes to above are made where necessary.  On exam, RRR.  CXR reveals stable leads, no ptx.  Device interrogation reveals normal device function (personally reviewed).  DC to home with routine wound care and follow-up.  Co Sign: Hillis Range, MD 09/09/2017 10:15 AM

## 2017-09-09 NOTE — Discharge Instructions (Signed)
° ° °  Supplemental Discharge Instructions for  Pacemaker Patients  Activity No heavy lifting or vigorous activity with your left/right arm for 6 to 8 weeks.  Do not raise your left/right arm above your head for one week.  Gradually raise your affected arm as drawn below.              09/12/17                    09/13/17                    09/14/17                   09/15/17 __  NO DRIVING for  1 week   ; you may begin driving on  2/13/086/21/19  .  WOUND CARE - Keep the wound area clean and dry.  Do not get this area wet for one week. No showers for one week; you may shower on 09/15/17  . - The tape/steri-strips on your wound will fall off; do not pull them off.  No bandage is needed on the site.  DO  NOT apply any creams, oils, or ointments to the wound area. - If you notice any drainage or discharge from the wound, any swelling or bruising at the site, or you develop a fever > 101? F after you are discharged home, call the office at once.  Special Instructions - You are still able to use cellular telephones; use the ear opposite the side where you have your pacemaker/defibrillator.  Avoid carrying your cellular phone near your device. - When traveling through airports, show security personnel your identification card to avoid being screened in the metal detectors.  Ask the security personnel to use the hand wand. - Avoid arc welding equipment, MRI testing (magnetic resonance imaging), TENS units (transcutaneous nerve stimulators).  Call the office for questions about other devices. - Avoid electrical appliances that are in poor condition or are not properly grounded. - Microwave ovens are safe to be near or to operate.    Heart Healthy Diet  Call Dr. Lubertha Basqueaylor's office for any questions about pacemaker or site.

## 2017-09-11 ENCOUNTER — Telehealth: Payer: Self-pay

## 2017-09-11 ENCOUNTER — Encounter (HOSPITAL_COMMUNITY): Payer: Self-pay | Admitting: Internal Medicine

## 2017-09-11 DIAGNOSIS — Z950 Presence of cardiac pacemaker: Secondary | ICD-10-CM

## 2017-09-11 DIAGNOSIS — Z79899 Other long term (current) drug therapy: Secondary | ICD-10-CM

## 2017-09-11 MED FILL — Lidocaine HCl Local Inj 1%: INTRAMUSCULAR | Qty: 40 | Status: AC

## 2017-09-11 MED FILL — Heparin Sod (Porcine)-NaCl IV Soln 1000 Unit/500ML-0.9%: INTRAVENOUS | Qty: 1000 | Status: AC

## 2017-09-11 NOTE — Telephone Encounter (Signed)
Preliminary reviewed by triage nurse, awaiting MD signature Pre-procedure labs drawn at OV on 6/13. Creatinine increased from 1.02 to 1.59 and potassium elevated at 5.4.   Patient had PPM Implant on 6/14 and spironolactone was decreased to 12.5 mg OD on patient's discharge instructions by Nada BoozerLaura Ingold, NP. Discussed with Nada BoozerLaura Ingold, NP and we will have repeat BMET done on 09/21/17 the same day as patient wound check appointment. Called and left detailed message on patient's VM making him aware.

## 2017-09-11 NOTE — Telephone Encounter (Signed)
Dr Herbie BaltimoreHarding spoke to Dr Pete GlatterStoneking on 09/07/17

## 2017-09-12 ENCOUNTER — Telehealth: Payer: Self-pay | Admitting: *Deleted

## 2017-09-12 NOTE — Telephone Encounter (Signed)
   [facility] Pre-operative Risk Assessment    Request for surgical clearance:  1. What type of surgery is being performed? RIGHT EYE LENS IMPLANT   2. When is this surgery scheduled? TBD   3. What type of clearance is required (medical clearance vs. Pharmacy clearance to hold med vs. Both)?  MEDICAL   4. Are there any medications that need to be held prior to surgery and how long?  N/A   5. Practice name and name of physician performing surgery? TBD [facility]   6. What is your office phone number [phone]     7.   What is your office fax number [place]   8.   Anesthesia type (None, local, MAC, general) ? NONE   Bentleigh Waren M 09/12/2017, 1:08 PM  _________________________________________________________________   (provider comments below)

## 2017-09-13 NOTE — Telephone Encounter (Signed)
Dr. Ladona Ridgelaylor. This patient had recent dual chamber pacemaker implanted on 09/08/17. Is it OK to have right lens implant since it is not likely entering the blood stream?  Please route your response back to CV DIV PREOP

## 2017-09-18 NOTE — Telephone Encounter (Signed)
Clearance letter faxed to surgeon. Please call the surgeon's office to ensure letter was received. This will be removed from the preop pool Tereso NewcomerScott Kenora Spayd, [place]    09/18/2017 2:38 PM

## 2017-09-18 NOTE — Telephone Encounter (Signed)
Ok for eye surgery. GT

## 2017-09-18 NOTE — Telephone Encounter (Signed)
Fax not received through electronically. I will fax a hard copy to [place] [facility] to [phone].

## 2017-09-21 ENCOUNTER — Other Ambulatory Visit: Payer: Medicare Other | Admitting: *Deleted

## 2017-09-21 ENCOUNTER — Telehealth: Payer: Self-pay | Admitting: Cardiology

## 2017-09-21 ENCOUNTER — Ambulatory Visit (INDEPENDENT_AMBULATORY_CARE_PROVIDER_SITE_OTHER): Payer: Medicare Other | Admitting: *Deleted

## 2017-09-21 DIAGNOSIS — Z79899 Other long term (current) drug therapy: Secondary | ICD-10-CM

## 2017-09-21 DIAGNOSIS — Z950 Presence of cardiac pacemaker: Secondary | ICD-10-CM

## 2017-09-21 DIAGNOSIS — I442 Atrioventricular block, complete: Secondary | ICD-10-CM

## 2017-09-21 LAB — CUP PACEART INCLINIC DEVICE CHECK
Battery Remaining Longevity: 47 mo
Battery Voltage: 3.14 V
Brady Statistic AP VP Percent: 7.98 %
Brady Statistic AP VS Percent: 0 %
Brady Statistic AS VP Percent: 82.02 %
Brady Statistic AS VS Percent: 10 %
Brady Statistic RA Percent Paced: 9.35 %
Brady Statistic RV Percent Paced: 90 %
Date Time Interrogation Session: 20190627135626
Implantable Lead Implant Date: 20190614
Implantable Lead Implant Date: 20190614
Implantable Lead Location: 753859
Implantable Lead Location: 753860
Implantable Lead Model: 3830
Implantable Lead Model: 5076
Implantable Pulse Generator Implant Date: 20190614
Lead Channel Impedance Value: 304 Ohm
Lead Channel Impedance Value: 323 Ohm
Lead Channel Impedance Value: 418 Ohm
Lead Channel Impedance Value: 475 Ohm
Lead Channel Pacing Threshold Amplitude: 0.5 V
Lead Channel Pacing Threshold Amplitude: 1 V
Lead Channel Pacing Threshold Pulse Width: 0.4 ms
Lead Channel Pacing Threshold Pulse Width: 0.4 ms
Lead Channel Sensing Intrinsic Amplitude: 4.5 mV
Lead Channel Sensing Intrinsic Amplitude: 7.125 mV
Lead Channel Setting Pacing Amplitude: 3.5 V
Lead Channel Setting Pacing Amplitude: 5 V
Lead Channel Setting Pacing Pulse Width: 1 ms
Lead Channel Setting Sensing Sensitivity: 2 mV

## 2017-09-21 LAB — BASIC METABOLIC PANEL
BUN/Creatinine Ratio: 16 (ref 10–24)
BUN: 22 mg/dL (ref 8–27)
CO2: 22 mmol/L (ref 20–29)
Calcium: 9.3 mg/dL (ref 8.6–10.2)
Chloride: 102 mmol/L (ref 96–106)
Creatinine, Ser: 1.41 mg/dL — ABNORMAL HIGH (ref 0.76–1.27)
GFR calc Af Amer: 57 mL/min/{1.73_m2} — ABNORMAL LOW (ref >59)
GFR calc non Af Amer: 49 mL/min/{1.73_m2} — ABNORMAL LOW (ref >59)
Glucose: 90 mg/dL (ref 65–99)
Potassium: 4.3 mmol/L (ref 3.5–5.2)
Sodium: 140 mmol/L (ref 134–144)

## 2017-09-21 NOTE — Telephone Encounter (Signed)
Notes recorded by Abelino DerrickKilroy, Luke K, PA-C on 09/21/2017 at 4:15 PM EDT Please let the patient know his labs looked OK. His renal function is still not 100%, but better than 2 weeks ago on decreased Aldactone dose. This can be followed up by Dr Pete GlatterStoneking, I would check again in one month.   Corine ShelterLUKE KILROY PA-C 09/21/2017 4:15 PM  Pt notified

## 2017-09-21 NOTE — Progress Notes (Signed)
Wound check appointment. Steri-strips removed. Wound without redness or edema. Incision edges approximated, wound well healed. Normal device function. Thresholds, sensing, and impedances consistent with implant measurements. Device programmed RA at 3.5V and RV at 5.0V for extra safety margin until 3 month visit. Per MDT rep Leta JunglingMarcia, no morphology change, HIS appears septal from 5.0V to loss of capture. Histogram distribution appropriate for patient and level of activity. No mode switches or high ventricular rates noted. Patient educated about wound care, arm mobility, lifting restrictions. ROV in 3 months with implanting physician. ROV with GT 12/13/17

## 2017-09-21 NOTE — Telephone Encounter (Signed)
New Message ° ° ° °Patient is returning call in reference to lab results. Please call.  °

## 2017-09-27 DIAGNOSIS — I442 Atrioventricular block, complete: Secondary | ICD-10-CM | POA: Diagnosis not present

## 2017-09-27 DIAGNOSIS — I129 Hypertensive chronic kidney disease with stage 1 through stage 4 chronic kidney disease, or unspecified chronic kidney disease: Secondary | ICD-10-CM | POA: Diagnosis not present

## 2017-09-27 DIAGNOSIS — R600 Localized edema: Secondary | ICD-10-CM | POA: Diagnosis not present

## 2017-09-27 DIAGNOSIS — N183 Chronic kidney disease, stage 3 (moderate): Secondary | ICD-10-CM | POA: Diagnosis not present

## 2017-10-02 DIAGNOSIS — H2511 Age-related nuclear cataract, right eye: Secondary | ICD-10-CM | POA: Diagnosis not present

## 2017-10-03 DIAGNOSIS — H2512 Age-related nuclear cataract, left eye: Secondary | ICD-10-CM | POA: Diagnosis not present

## 2017-10-03 DIAGNOSIS — Z961 Presence of intraocular lens: Secondary | ICD-10-CM | POA: Diagnosis not present

## 2017-10-30 DIAGNOSIS — H2512 Age-related nuclear cataract, left eye: Secondary | ICD-10-CM | POA: Diagnosis not present

## 2017-11-07 DIAGNOSIS — Z79899 Other long term (current) drug therapy: Secondary | ICD-10-CM | POA: Diagnosis not present

## 2017-11-07 DIAGNOSIS — R600 Localized edema: Secondary | ICD-10-CM | POA: Diagnosis not present

## 2017-11-11 DIAGNOSIS — H04123 Dry eye syndrome of bilateral lacrimal glands: Secondary | ICD-10-CM | POA: Diagnosis not present

## 2017-12-09 DIAGNOSIS — H04123 Dry eye syndrome of bilateral lacrimal glands: Secondary | ICD-10-CM | POA: Diagnosis not present

## 2017-12-13 ENCOUNTER — Ambulatory Visit: Payer: Medicare Other | Admitting: Internal Medicine

## 2017-12-13 ENCOUNTER — Encounter: Payer: Self-pay | Admitting: Internal Medicine

## 2017-12-13 VITALS — BP 118/74 | HR 86 | Ht 70.5 in | Wt 217.0 lb

## 2017-12-13 DIAGNOSIS — I442 Atrioventricular block, complete: Secondary | ICD-10-CM | POA: Diagnosis not present

## 2017-12-13 DIAGNOSIS — Z950 Presence of cardiac pacemaker: Secondary | ICD-10-CM | POA: Diagnosis not present
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
## 2017-12-13 NOTE — Patient Instructions (Signed)
Medication Instructions:  Your physician recommends that you continue on your current medications as directed. Please refer to the Current Medication list given to you today.  Labwork: None ordered.  Testing/Procedures: None ordered.  Follow-Up: Your physician wants you to follow-up in: 9 months with Dr. Ladona Ridgelaylor.   You will receive a reminder letter in the mail two months in advance. If you don't receive a letter, please call our office to schedule the follow-up appointment.  Remote monitoring is used to monitor your Pacemaker from home. This monitoring reduces the number of office visits required to check your device to one time per year. It allows us to keep an eye on the functioning of your device to ensure it is working properly. You are scheduled for a device check from home on 03/14/2018. You may send your transmission at any time that day. If you have a wireless device, the transmission will be sent automatically. After your physician reviews your transmission, you will receive a postcard with your next transmission date.  Any Other Special Instructions Will Be Listed Below (If Applicable).  If you need a refill on your cardiac medications before your next appointment, please call your pharmacy.

## 2018-01-11 ENCOUNTER — Telehealth: Payer: Self-pay | Admitting: Internal Medicine

## 2018-01-11 NOTE — Telephone Encounter (Signed)
New Message  Pt states that 2 weeks ago some modifications were made to his pacemaker and since then he has been experiencing some sob and fatigue and wants to know if that is normal  Pt c/o Shortness Of Breath: STAT if SOB developed within the last 24 hours or pt is noticeably SOB on the phone  1. Are you currently SOB (can you hear that pt is SOB on the phone)? No  2. How long have you been experiencing SOB? About 2weeks  3. Are you SOB when sitting or when up moving around? Moving around  4. Are you currently experiencing any other symptoms? Fatigue

## 2018-01-11 NOTE — Telephone Encounter (Signed)
Reviewed device check in clinic 12/07/17, I don't see that changes were made to programming. I have asked Brian [name] to send a manual transmission when he gets home tonight (about 9pm) and we will review and contact him in the morning. He verbalizes understanding and is appreciative.

## 2018-01-12 NOTE — Telephone Encounter (Signed)
Remote transmission reviewed. Presenting rhythm: AsVp w/PVCs. (PVCs also noted at  Office visit with GT). No atrial or ventricular arrhythmias. Stable lead measurements. Normal device function.  Called to inform patient that his remote transmission was stable. Patient states that he also has a cold at the moment, and isn't sure if the ShOB is coming from that. I asked patient to call back if his ShOB persists after he gets over the cold. Patient verbalized understanding and appreciation of information.

## 2018-03-14 ENCOUNTER — Ambulatory Visit (INDEPENDENT_AMBULATORY_CARE_PROVIDER_SITE_OTHER): Payer: Medicare Other

## 2018-03-14 ENCOUNTER — Telehealth: Payer: Self-pay

## 2018-03-14 DIAGNOSIS — I442 Atrioventricular block, complete: Secondary | ICD-10-CM | POA: Diagnosis not present

## 2018-03-14 NOTE — Progress Notes (Signed)
Remote pacemaker transmission.   

## 2018-03-14 NOTE — Telephone Encounter (Signed)
Spoke with pt and reminded pt of remote transmission that is due today. Pt verbalized understanding.   

## 2018-03-15 ENCOUNTER — Encounter: Payer: Self-pay | Admitting: Cardiology

## 2018-04-15 LAB — CUP PACEART REMOTE DEVICE CHECK
Battery Remaining Longevity: 121 mo
Battery Voltage: 3.02 V
Brady Statistic AP VP Percent: 9.55 %
Brady Statistic AP VS Percent: 0.01 %
Brady Statistic AS VP Percent: 79.33 %
Brady Statistic AS VS Percent: 11.11 %
Brady Statistic RA Percent Paced: 12.58 %
Brady Statistic RV Percent Paced: 88.88 %
Date Time Interrogation Session: 20191218145133
Implantable Lead Implant Date: 20190614
Implantable Lead Implant Date: 20190614
Implantable Lead Location: 753859
Implantable Lead Location: 753860
Implantable Lead Model: 3830
Implantable Lead Model: 5076
Implantable Pulse Generator Implant Date: 20190614
Lead Channel Impedance Value: 304 Ohm
Lead Channel Impedance Value: 323 Ohm
Lead Channel Impedance Value: 456 Ohm
Lead Channel Impedance Value: 513 Ohm
Lead Channel Pacing Threshold Amplitude: 0.75 V
Lead Channel Pacing Threshold Amplitude: 0.875 V
Lead Channel Pacing Threshold Pulse Width: 0.4 ms
Lead Channel Pacing Threshold Pulse Width: 0.4 ms
Lead Channel Sensing Intrinsic Amplitude: 4 mV
Lead Channel Sensing Intrinsic Amplitude: 4 mV
Lead Channel Sensing Intrinsic Amplitude: 6.75 mV
Lead Channel Sensing Intrinsic Amplitude: 6.75 mV
Lead Channel Setting Pacing Amplitude: 1.75 V
Lead Channel Setting Pacing Amplitude: 2.5 V
Lead Channel Setting Pacing Pulse Width: 0.6 ms
Lead Channel Setting Sensing Sensitivity: 2 mV

## 2018-04-16 ENCOUNTER — Ambulatory Visit
Admission: RE | Admit: 2018-04-16 | Discharge: 2018-04-16 | Disposition: A | Discharge status: Home / Self Care | Payer: Medicare Other | Source: Ambulatory Visit | Attending: Geriatric Medicine | Admitting: Geriatric Medicine

## 2018-04-16 ENCOUNTER — Other Ambulatory Visit: Payer: Self-pay | Admitting: Geriatric Medicine

## 2018-04-16 DIAGNOSIS — Z0000 Encounter for general adult medical examination without abnormal findings: Secondary | ICD-10-CM | POA: Diagnosis not present

## 2018-04-16 DIAGNOSIS — I129 Hypertensive chronic kidney disease with stage 1 through stage 4 chronic kidney disease, or unspecified chronic kidney disease: Secondary | ICD-10-CM | POA: Diagnosis not present

## 2018-04-16 DIAGNOSIS — R062 Wheezing: Secondary | ICD-10-CM

## 2018-04-16 DIAGNOSIS — E782 Mixed hyperlipidemia: Secondary | ICD-10-CM | POA: Diagnosis not present

## 2018-04-16 DIAGNOSIS — E781 Pure hyperglyceridemia: Secondary | ICD-10-CM | POA: Diagnosis not present

## 2018-04-16 DIAGNOSIS — J45909 Unspecified asthma, uncomplicated: Secondary | ICD-10-CM | POA: Diagnosis not present

## 2018-04-18 DIAGNOSIS — E782 Mixed hyperlipidemia: Secondary | ICD-10-CM | POA: Diagnosis not present

## 2018-04-19 ENCOUNTER — Other Ambulatory Visit (HOSPITAL_COMMUNITY): Payer: Self-pay | Admitting: Geriatric Medicine

## 2018-04-19 ENCOUNTER — Other Ambulatory Visit: Payer: Self-pay

## 2018-04-19 ENCOUNTER — Ambulatory Visit (HOSPITAL_COMMUNITY): Payer: Medicare Other | Attending: Cardiovascular Disease

## 2018-04-19 ENCOUNTER — Other Ambulatory Visit (HOSPITAL_COMMUNITY): Payer: Medicare Other

## 2018-04-19 DIAGNOSIS — R600 Localized edema: Secondary | ICD-10-CM | POA: Diagnosis not present

## 2018-04-19 DIAGNOSIS — R609 Edema, unspecified: Secondary | ICD-10-CM

## 2018-04-19 MED ORDER — PERFLUTREN LIPID MICROSPHERE
1.0000 mL | INTRAVENOUS | Status: AC | PRN
  Administered 2018-04-19: 2 mL via INTRAVENOUS

## 2018-04-23 ENCOUNTER — Other Ambulatory Visit: Payer: Self-pay | Admitting: Geriatric Medicine

## 2018-04-23 ENCOUNTER — Other Ambulatory Visit (HOSPITAL_COMMUNITY): Payer: Self-pay | Admitting: Geriatric Medicine

## 2018-04-23 DIAGNOSIS — R600 Localized edema: Secondary | ICD-10-CM

## 2018-05-03 ENCOUNTER — Ambulatory Visit (INDEPENDENT_AMBULATORY_CARE_PROVIDER_SITE_OTHER): Payer: Medicare Other | Admitting: Nurse Practitioner

## 2018-05-03 VITALS — BP 138/72 | HR 95 | Ht 70.5 in | Wt 220.0 lb

## 2018-05-03 DIAGNOSIS — I442 Atrioventricular block, complete: Secondary | ICD-10-CM | POA: Diagnosis not present

## 2018-05-03 DIAGNOSIS — Z01812 Encounter for preprocedural laboratory examination: Secondary | ICD-10-CM | POA: Diagnosis not present

## 2018-05-03 DIAGNOSIS — I493 Ventricular premature depolarization: Secondary | ICD-10-CM

## 2018-05-03 DIAGNOSIS — I5022 Chronic systolic (congestive) heart failure: Secondary | ICD-10-CM | POA: Diagnosis not present

## 2018-05-03 MED ORDER — CARVEDILOL 12.5 MG PO TABS: 12.5000 mg | ORAL_TABLET | Freq: Two times a day (BID) | ORAL | 3 refills | Status: DC

## 2018-05-03 NOTE — Patient Instructions (Signed)
Medication Instructions:  STOP AMLODIPINE START CARVEDILOL 12.5 mg twice daily  If you need a refill on your cardiac medications before your next appointment, please call your pharmacy.   Lab work:TODAY CBC BMET If you have labs (blood work) drawn today and your tests are completely normal, you will receive your results only by: Marland Kitchen MyChart Message (if you have MyChart) OR . A paper copy in the mail If you have any lab test that is abnormal or we need to change your treatment, we will call you to review the results.  Testing/Procedures: Your physician has requested that you have a cardiac catheterization. Cardiac catheterization is used to diagnose and/or treat various heart conditions. Doctors may recommend this procedure for a number of different reasons. The most common reason is to evaluate chest pain. Chest pain can be a symptom of coronary artery disease (CAD), and cardiac catheterization can show whether plaque is narrowing or blocking your heart's arteries. This procedure is also used to evaluate the valves, as well as measure the blood flow and oxygen levels in different parts of your heart. For further information please visit [email or web address] Please follow instruction sheet, as given.    Follow-Up: At [facility], you and your health needs are our priority.  As part of our continuing mission to provide you with exceptional heart care, we have created designated Provider Care Teams.  These Care Teams include your primary Cardiologist (physician) and Advanced Practice Providers (APPs -  Physician Assistants and Nurse Practitioners) who all work together to provide you with the care you need, when you need it. .   Any Other Special Instructions Will Be Listed Below (If Applicable).    [facility] CARDIOVASCULAR DIVISION [facility] ST OFFICE [address] [place], [address] [place] [address] Dept: [phone] Loc: [phone]  LOTANNA PISCITELLI  05/03/2018  You are scheduled for a Cardiac Catheterization on Wednesday, February 12 with Dr. Verdis Prime.  1. Please arrive at the [facility] (Main Entrance A) at [facility]: [address] [place], [place] [address] at 9:30 AM (This time is two hours before your procedure to ensure your preparation). Free valet parking service is available.   Special note: Every effort is made to have your procedure done on time. Please understand that emergencies sometimes delay scheduled procedures.  2. Diet: Do not eat solid foods after midnight.  The patient may have clear liquids until 5am upon the day of the procedure.  3. Medication instructions in preparation for your procedure: DO NOT TAKE LASIX MORNING OF PROCEDURE DO NOT TAKE SPIRONOLACTONE MORNING OF PROCEDURE     On the morning of your procedure, take your Aspirin 81 mg and any morning medicines NOT listed above.  You may use sips of water.  5. Plan for one night stay--bring personal belongings. 6. Bring a current list of your medications and current insurance cards. 7. You MUST have a responsible person to drive you home. 8. Someone MUST be with you the first 24 hours after you arrive home or your discharge will be delayed. 9. Please wear clothes that are easy to get on and off and wear slip-on shoes.  Thank you for allowing [place] to care for you!   -- [facility]

## 2018-05-03 NOTE — Addendum Note (Signed)
Addended by: Oleta Mouse on: 05/03/2018 04:03 PM   Modules accepted: Orders
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
## 2018-05-04 ENCOUNTER — Telehealth: Payer: Self-pay

## 2018-05-04 LAB — CBC
Hematocrit: 40.3 % (ref 37.5–51.0)
Hemoglobin: 14.1 g/dL (ref 13.0–17.7)
MCH: 30.7 pg (ref 26.6–33.0)
MCHC: 35 g/dL (ref 31.5–35.7)
MCV: 88 fL (ref 79–97)
Platelets: 207 10*3/uL (ref 150–450)
RBC: 4.59 x10E6/uL (ref 4.14–5.80)
RDW: 13.2 % (ref 11.6–15.4)
WBC: 7.5 10*3/uL (ref 3.4–10.8)

## 2018-05-04 LAB — BASIC METABOLIC PANEL
BUN/Creatinine Ratio: 14 (ref 10–24)
BUN: 17 mg/dL (ref 8–27)
CO2: 22 mmol/L (ref 20–29)
Calcium: 8.9 mg/dL (ref 8.6–10.2)
Chloride: 100 mmol/L (ref 96–106)
Creatinine, Ser: 1.22 mg/dL (ref 0.76–1.27)
GFR calc Af Amer: 68 mL/min/{1.73_m2} (ref >59)
GFR calc non Af Amer: 59 mL/min/{1.73_m2} — ABNORMAL LOW (ref >59)
Glucose: 92 mg/dL (ref 65–99)
Potassium: 4.1 mmol/L (ref 3.5–5.2)
Sodium: 140 mmol/L (ref 134–144)

## 2018-05-04 NOTE — Telephone Encounter (Signed)
Notes recorded by Sigurd Sos, RN on 05/04/2018 at 9:27 AM EST lpmtcb 2/7

## 2018-05-04 NOTE — Telephone Encounter (Signed)
New message   Patient is returning call for blood work results.

## 2018-05-04 NOTE — Telephone Encounter (Signed)
-----   Message from Marily Lente, NP sent at 05/04/2018  8:05 AM EST ----- Please notify patient of stable labs. Thanks!

## 2018-05-04 NOTE — Telephone Encounter (Signed)
Notes recorded by Sigurd Sos, RN on 05/04/2018 at 10:45 AM EST The patient has been notified of the result and verbalized understanding. All questions (if any) were answered. Sigurd Sos, RN 05/04/2018 10:44 AM

## 2018-05-04 NOTE — Telephone Encounter (Signed)
-----   Message from Amber K Seiler, NP sent at 05/04/2018  8:05 AM EST ----- Please notify patient of stable labs. Thanks! 

## 2018-05-07 ENCOUNTER — Other Ambulatory Visit: Payer: Self-pay

## 2018-05-07 ENCOUNTER — Inpatient Hospital Stay (HOSPITAL_COMMUNITY)
Admission: EM | Admit: 2018-05-07 | Discharge: 2018-05-17 | DRG: 233 | Disposition: A | Discharge status: Home / Self Care | Payer: Medicare Other | Attending: Thoracic Surgery (Cardiothoracic Vascular Surgery) | Admitting: Thoracic Surgery (Cardiothoracic Vascular Surgery)

## 2018-05-07 ENCOUNTER — Telehealth: Payer: Self-pay | Admitting: *Deleted

## 2018-05-07 ENCOUNTER — Emergency Department (HOSPITAL_COMMUNITY): Payer: Medicare Other

## 2018-05-07 ENCOUNTER — Encounter (HOSPITAL_COMMUNITY): Payer: Self-pay | Admitting: Emergency Medicine

## 2018-05-07 DIAGNOSIS — Z885 Allergy status to narcotic agent status: Secondary | ICD-10-CM

## 2018-05-07 DIAGNOSIS — Z01810 Encounter for preprocedural cardiovascular examination: Secondary | ICD-10-CM | POA: Diagnosis not present

## 2018-05-07 DIAGNOSIS — R0989 Other specified symptoms and signs involving the circulatory and respiratory systems: Secondary | ICD-10-CM | POA: Diagnosis not present

## 2018-05-07 DIAGNOSIS — Z87891 Personal history of nicotine dependence: Secondary | ICD-10-CM | POA: Diagnosis not present

## 2018-05-07 DIAGNOSIS — J9811 Atelectasis: Secondary | ICD-10-CM | POA: Diagnosis not present

## 2018-05-07 DIAGNOSIS — Z79899 Other long term (current) drug therapy: Secondary | ICD-10-CM | POA: Diagnosis not present

## 2018-05-07 DIAGNOSIS — N183 Chronic kidney disease, stage 3 (moderate): Secondary | ICD-10-CM | POA: Diagnosis present

## 2018-05-07 DIAGNOSIS — J449 Chronic obstructive pulmonary disease, unspecified: Secondary | ICD-10-CM | POA: Diagnosis not present

## 2018-05-07 DIAGNOSIS — I110 Hypertensive heart disease with heart failure: Secondary | ICD-10-CM | POA: Diagnosis not present

## 2018-05-07 DIAGNOSIS — I5043 Acute on chronic combined systolic (congestive) and diastolic (congestive) heart failure: Secondary | ICD-10-CM | POA: Diagnosis not present

## 2018-05-07 DIAGNOSIS — Z950 Presence of cardiac pacemaker: Secondary | ICD-10-CM | POA: Diagnosis not present

## 2018-05-07 DIAGNOSIS — R001 Bradycardia, unspecified: Secondary | ICD-10-CM | POA: Diagnosis not present

## 2018-05-07 DIAGNOSIS — I255 Ischemic cardiomyopathy: Secondary | ICD-10-CM | POA: Diagnosis present

## 2018-05-07 DIAGNOSIS — N170 Acute kidney failure with tubular necrosis: Secondary | ICD-10-CM | POA: Diagnosis not present

## 2018-05-07 DIAGNOSIS — M1990 Unspecified osteoarthritis, unspecified site: Secondary | ICD-10-CM | POA: Diagnosis present

## 2018-05-07 DIAGNOSIS — N182 Chronic kidney disease, stage 2 (mild): Secondary | ICD-10-CM | POA: Diagnosis present

## 2018-05-07 DIAGNOSIS — E669 Obesity, unspecified: Secondary | ICD-10-CM | POA: Diagnosis not present

## 2018-05-07 DIAGNOSIS — I10 Essential (primary) hypertension: Secondary | ICD-10-CM | POA: Diagnosis present

## 2018-05-07 DIAGNOSIS — Z4682 Encounter for fitting and adjustment of non-vascular catheter: Secondary | ICD-10-CM | POA: Diagnosis not present

## 2018-05-07 DIAGNOSIS — I509 Heart failure, unspecified: Secondary | ICD-10-CM | POA: Diagnosis not present

## 2018-05-07 DIAGNOSIS — Z888 Allergy status to other drugs, medicaments and biological substances status: Secondary | ICD-10-CM | POA: Diagnosis not present

## 2018-05-07 DIAGNOSIS — I371 Nonrheumatic pulmonary valve insufficiency: Secondary | ICD-10-CM | POA: Diagnosis not present

## 2018-05-07 DIAGNOSIS — I130 Hypertensive heart and chronic kidney disease with heart failure and stage 1 through stage 4 chronic kidney disease, or unspecified chronic kidney disease: Principal | ICD-10-CM | POA: Diagnosis present

## 2018-05-07 DIAGNOSIS — I447 Left bundle-branch block, unspecified: Secondary | ICD-10-CM | POA: Diagnosis not present

## 2018-05-07 DIAGNOSIS — H9190 Unspecified hearing loss, unspecified ear: Secondary | ICD-10-CM | POA: Diagnosis present

## 2018-05-07 DIAGNOSIS — I442 Atrioventricular block, complete: Secondary | ICD-10-CM | POA: Diagnosis not present

## 2018-05-07 DIAGNOSIS — I5023 Acute on chronic systolic (congestive) heart failure: Secondary | ICD-10-CM

## 2018-05-07 DIAGNOSIS — I4892 Unspecified atrial flutter: Secondary | ICD-10-CM | POA: Diagnosis not present

## 2018-05-07 DIAGNOSIS — Z951 Presence of aortocoronary bypass graft: Secondary | ICD-10-CM

## 2018-05-07 DIAGNOSIS — Z6830 Body mass index (BMI) 30.0-30.9, adult: Secondary | ICD-10-CM | POA: Diagnosis not present

## 2018-05-07 DIAGNOSIS — R079 Chest pain, unspecified: Secondary | ICD-10-CM | POA: Diagnosis not present

## 2018-05-07 DIAGNOSIS — D62 Acute posthemorrhagic anemia: Secondary | ICD-10-CM | POA: Diagnosis not present

## 2018-05-07 DIAGNOSIS — J986 Disorders of diaphragm: Secondary | ICD-10-CM | POA: Diagnosis not present

## 2018-05-07 DIAGNOSIS — Z7982 Long term (current) use of aspirin: Secondary | ICD-10-CM

## 2018-05-07 DIAGNOSIS — J90 Pleural effusion, not elsewhere classified: Secondary | ICD-10-CM

## 2018-05-07 DIAGNOSIS — I2510 Atherosclerotic heart disease of native coronary artery without angina pectoris: Secondary | ICD-10-CM

## 2018-05-07 DIAGNOSIS — I340 Nonrheumatic mitral (valve) insufficiency: Secondary | ICD-10-CM | POA: Diagnosis not present

## 2018-05-07 HISTORY — DX: Atherosclerotic heart disease of native coronary artery without angina pectoris: I25.10

## 2018-05-07 HISTORY — DX: Chronic kidney disease, stage 2 (mild): N18.2

## 2018-05-07 LAB — I-STAT TROPONIN, ED: Troponin i, poc: 0.02 ng/mL (ref 0.00–0.08)

## 2018-05-07 MED ORDER — SODIUM CHLORIDE 0.9% FLUSH: 3.0000 mL | Freq: Once | INTRAVENOUS | Status: DC

## 2018-05-07 NOTE — ED Triage Notes (Signed)
Pt c/o increased shortness of breath and occasional chest pain. Reports that he is scheduled to go to the cath lab Wednesday d/t pacemaker leads not working properly. Denies chest pain at this time. Speaking in short phrases.

## 2018-05-07 NOTE — Telephone Encounter (Signed)
Pt contacted pre-catheterization scheduled at [facility] for: Wednesday May 09, 2018 11:30 AM Verified arrival time and place: [facility] Main Entrance A at: 9:30 AM  No solid food after midnight prior to cath, clear liquids until 5 AM day of procedure. Contrast allergy: no Verified no diabetes medications.  Hold: Furosemide-AM of procedure. KCl-Am of procedure.  Losartan-AM of procedure.  Except hold medications AM meds can be  taken pre-cath with sip of water including: ASA 81 mg-pt states he is currently taking and tolerating aspirin.  Confirmed patient has responsible person to drive home post procedure and observe 24 hours after arriving home: yes

## 2018-05-08 ENCOUNTER — Other Ambulatory Visit: Payer: Self-pay

## 2018-05-08 ENCOUNTER — Telehealth: Payer: Self-pay | Admitting: Nurse Practitioner

## 2018-05-08 ENCOUNTER — Encounter (HOSPITAL_COMMUNITY): Payer: Self-pay

## 2018-05-08 DIAGNOSIS — I5023 Acute on chronic systolic (congestive) heart failure: Secondary | ICD-10-CM | POA: Diagnosis not present

## 2018-05-08 LAB — CBC
HCT: 40.6 % (ref 39.0–52.0)
Hemoglobin: 13.2 g/dL (ref 13.0–17.0)
MCH: 30 pg (ref 26.0–34.0)
MCHC: 32.5 g/dL (ref 30.0–36.0)
MCV: 92.3 fL (ref 80.0–100.0)
Platelets: 159 10*3/uL (ref 150–400)
RBC: 4.4 MIL/uL (ref 4.22–5.81)
RDW: 13.1 % (ref 11.5–15.5)
WBC: 5.9 10*3/uL (ref 4.0–10.5)
nRBC: 0 % (ref 0.0–0.2)

## 2018-05-08 LAB — BASIC METABOLIC PANEL
Anion gap: 11 (ref 5–15)
BUN: 20 mg/dL (ref 8–23)
CO2: 23 mmol/L (ref 22–32)
Calcium: 8.9 mg/dL (ref 8.9–10.3)
Chloride: 106 mmol/L (ref 98–111)
Creatinine, Ser: 1.54 mg/dL — ABNORMAL HIGH (ref 0.61–1.24)
GFR calc Af Amer: 51 mL/min — ABNORMAL LOW (ref >60)
GFR calc non Af Amer: 44 mL/min — ABNORMAL LOW (ref >60)
Glucose, Bld: 107 mg/dL — ABNORMAL HIGH (ref 70–99)
Potassium: 3.9 mmol/L (ref 3.5–5.1)
Sodium: 140 mmol/L (ref 135–145)

## 2018-05-08 LAB — PROTIME-INR
INR: 1.08
Prothrombin Time: 13.9 seconds (ref 11.4–15.2)

## 2018-05-08 LAB — MAGNESIUM: Magnesium: 2.3 mg/dL (ref 1.7–2.4)

## 2018-05-08 LAB — BRAIN NATRIURETIC PEPTIDE: B Natriuretic Peptide: 1006.3 pg/mL — ABNORMAL HIGH (ref 0.0–100.0)

## 2018-05-08 MED ORDER — ONDANSETRON HCL 4 MG/2ML IJ SOLN: 4.0000 mg | Freq: Four times a day (QID) | INTRAMUSCULAR | Status: DC | PRN

## 2018-05-08 MED ORDER — EQ VISION FORMULA 50+ PO CAPS: 1.0000 | ORAL_CAPSULE | Freq: Every day | ORAL | Status: DC

## 2018-05-08 MED ORDER — SODIUM CHLORIDE 0.9 % IV SOLN
250.0000 mL | INTRAVENOUS | Status: DC | PRN
  Administered 2018-05-10 (×2): via INTRAVENOUS

## 2018-05-08 MED ORDER — VITAMIN C 500 MG PO TABS
1000.0000 mg | ORAL_TABLET | Freq: Every day | ORAL | Status: DC
  Administered 2018-05-08 – 2018-05-09 (×2): 1000 mg via ORAL
  Filled 2018-05-08 (×2): qty 2

## 2018-05-08 MED ORDER — PROSIGHT PO TABS
1.0000 | ORAL_TABLET | Freq: Every day | ORAL | Status: DC
  Administered 2018-05-08 – 2018-05-09 (×2): 1 via ORAL
  Filled 2018-05-08 (×2): qty 1

## 2018-05-08 MED ORDER — SODIUM CHLORIDE 0.9 % IV SOLN: INTRAVENOUS | Status: DC

## 2018-05-08 MED ORDER — ASPIRIN 81 MG PO CHEW
324.0000 mg | CHEWABLE_TABLET | Freq: Once | ORAL | Status: AC
  Administered 2018-05-08: 324 mg via ORAL

## 2018-05-08 MED ORDER — ASPIRIN EC 81 MG PO TBEC
81.0000 mg | DELAYED_RELEASE_TABLET | Freq: Every day | ORAL | Status: DC
  Administered 2018-05-08: 81 mg via ORAL
  Filled 2018-05-08: qty 1

## 2018-05-08 MED ORDER — SODIUM CHLORIDE 0.9% FLUSH
3.0000 mL | Freq: Two times a day (BID) | INTRAVENOUS | Status: DC
  Administered 2018-05-08 – 2018-05-09 (×3): 3 mL via INTRAVENOUS

## 2018-05-08 MED ORDER — SACUBITRIL-VALSARTAN 49-51 MG PO TABS
1.0000 | ORAL_TABLET | Freq: Two times a day (BID) | ORAL | Status: DC
  Administered 2018-05-09 (×2): 1 via ORAL
  Filled 2018-05-08 (×3): qty 1

## 2018-05-08 MED ORDER — ASPIRIN 81 MG PO CHEW
CHEWABLE_TABLET | ORAL | Status: AC
  Filled 2018-05-08: qty 4

## 2018-05-08 MED ORDER — LOSARTAN POTASSIUM 50 MG PO TABS
50.0000 mg | ORAL_TABLET | Freq: Every day | ORAL | Status: DC
  Administered 2018-05-08: 50 mg via ORAL
  Filled 2018-05-08: qty 1

## 2018-05-08 MED ORDER — SODIUM CHLORIDE 0.9 % IV SOLN: 250.0000 mL | INTRAVENOUS | Status: DC | PRN

## 2018-05-08 MED ORDER — ACETAMINOPHEN 325 MG PO TABS
650.0000 mg | ORAL_TABLET | ORAL | Status: DC | PRN
  Administered 2018-05-08: 650 mg via ORAL
  Filled 2018-05-08: qty 2

## 2018-05-08 MED ORDER — FUROSEMIDE 10 MG/ML IJ SOLN
80.0000 mg | Freq: Once | INTRAMUSCULAR | Status: AC
  Administered 2018-05-08: 80 mg via INTRAVENOUS

## 2018-05-08 MED ORDER — SODIUM CHLORIDE 0.9% FLUSH: 3.0000 mL | INTRAVENOUS | Status: DC | PRN

## 2018-05-08 MED ORDER — SODIUM CHLORIDE 0.9% FLUSH
3.0000 mL | Freq: Two times a day (BID) | INTRAVENOUS | Status: DC
  Administered 2018-05-09: 3 mL via INTRAVENOUS

## 2018-05-08 MED ORDER — ASPIRIN 81 MG PO CHEW
81.0000 mg | CHEWABLE_TABLET | ORAL | Status: AC
  Administered 2018-05-09: 81 mg via ORAL
  Filled 2018-05-08: qty 1

## 2018-05-08 MED ORDER — FUROSEMIDE 10 MG/ML IJ SOLN
INTRAMUSCULAR | Status: AC
  Filled 2018-05-08: qty 8

## 2018-05-08 MED ORDER — CARVEDILOL 12.5 MG PO TABS
12.5000 mg | ORAL_TABLET | Freq: Two times a day (BID) | ORAL | Status: DC
  Administered 2018-05-08 – 2018-05-09 (×4): 12.5 mg via ORAL
  Filled 2018-05-08 (×4): qty 1

## 2018-05-08 MED ORDER — CYCLOSPORINE 0.05 % OP EMUL
1.0000 [drp] | Freq: Two times a day (BID) | OPHTHALMIC | Status: DC
  Administered 2018-05-09 – 2018-05-16 (×15): 1 [drp] via OPHTHALMIC
  Filled 2018-05-08 (×19): qty 30
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
## 2018-05-08 NOTE — Telephone Encounter (Signed)
New message   Patient's wife would like Gypsy Balsammber Seiler to know [facility]

## 2018-05-08 NOTE — ED Notes (Signed)
Pacer Pads placed on pt 

## 2018-05-08 NOTE — ED Notes (Signed)
Spoke with [place] from [place] who states that the lead measurements are WNL and that it doesn't show any events. Pacer supposed to be set at 60bpm. States that it does show PVCs measured at 341 per hour.
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
## 2018-05-08 NOTE — ED Provider Notes (Signed)
Medical screening examination/treatment/procedure(s) were conducted as a shared visit with non-physician practitioner(s) and myself.  I personally evaluated the patient during the encounter.  EKG Interpretation  Date/Time:  Tuesday May 08 2018 00:25:24 EST Ventricular Rate:  74 PR Interval:    QRS Duration: 139 QT Interval:  466 QTC Calculation: 518 R Axis:   20 Text Interpretation:  Sinus rhythm Ventricular premature complex IVCD, consider atypical LBBB No significant change since last tracing Confirmed by Rochele Raring [phone]) on 05/08/2018 12:31:00 AM    Patient is a 73 year old male with history of CHF status post pacemaker, third-degree AV block who presents to the emergency department with shortness of breath.  Appears volume overloaded.  Chest x-ray shows edema.  Will give IV Lasix and admit.  Scheduled for cardiac catheterization tomorrow.  Admitted to cardiology service.  Did have brief episode of bradycardia.  Pacer pads in place.  Continue to monitor closely.  Was just recently started on beta-blockers.   Shaolin Armas, Layla Maw, DO 05/08/18 0131

## 2018-05-08 NOTE — Care Management Note (Signed)
Case Management Note  Patient Details  Name: Brian [name] MRN: [id number] Date of Birth: 04-Oct-1945  Subjective/Objective:   CHF                Action/Plan: Primary Physician: Merlene LaughterStoneking, Hal, MD; has private insurance with BCBS with prescription drug coverage; CM will continue to follow for progression of care.  Expected Discharge Date:   possibly 04/12/2018               Expected Discharge Plan:   Home  Discharge planning Services  CM Consult    Status of Service:  In process, will continue to follow  Reola MosherChandler, Dylana Shaw L, RN,MHA,BSN [phone] 05/08/2018, 3:00 PM

## 2018-05-09 ENCOUNTER — Observation Stay (HOSPITAL_COMMUNITY): Payer: Medicare Other

## 2018-05-09 ENCOUNTER — Other Ambulatory Visit: Payer: Self-pay | Admitting: *Deleted

## 2018-05-09 ENCOUNTER — Encounter (HOSPITAL_COMMUNITY): Payer: Self-pay | Admitting: Interventional Cardiology

## 2018-05-09 ENCOUNTER — Encounter (HOSPITAL_COMMUNITY)
Admission: EM | Disposition: A | Discharge status: Home / Self Care | Payer: Self-pay | Source: Home / Self Care | Attending: Thoracic Surgery (Cardiothoracic Vascular Surgery)

## 2018-05-09 ENCOUNTER — Ambulatory Visit (HOSPITAL_COMMUNITY): Admit: 2018-05-09 | Payer: Medicare Other | Admitting: Interventional Cardiology

## 2018-05-09 DIAGNOSIS — I10 Essential (primary) hypertension: Secondary | ICD-10-CM

## 2018-05-09 DIAGNOSIS — Z01810 Encounter for preprocedural cardiovascular examination: Secondary | ICD-10-CM | POA: Diagnosis not present

## 2018-05-09 DIAGNOSIS — R001 Bradycardia, unspecified: Secondary | ICD-10-CM | POA: Diagnosis not present

## 2018-05-09 DIAGNOSIS — I2510 Atherosclerotic heart disease of native coronary artery without angina pectoris: Secondary | ICD-10-CM

## 2018-05-09 DIAGNOSIS — N182 Chronic kidney disease, stage 2 (mild): Secondary | ICD-10-CM | POA: Diagnosis present

## 2018-05-09 DIAGNOSIS — I442 Atrioventricular block, complete: Secondary | ICD-10-CM

## 2018-05-09 DIAGNOSIS — I5023 Acute on chronic systolic (congestive) heart failure: Secondary | ICD-10-CM

## 2018-05-09 HISTORY — PX: RIGHT/LEFT HEART CATH AND CORONARY ANGIOGRAPHY: CATH118266

## 2018-05-09 LAB — POCT I-STAT 7, (LYTES, BLD GAS, ICA,H+H)
Acid-Base Excess: 1 mmol/L (ref 0.0–2.0)
Bicarbonate: 24.1 mmol/L (ref 20.0–28.0)
Calcium, Ion: 1.17 mmol/L (ref 1.15–1.40)
HCT: 38 % — ABNORMAL LOW (ref 39.0–52.0)
Hemoglobin: 12.9 g/dL — ABNORMAL LOW (ref 13.0–17.0)
O2 Saturation: 97 %
Potassium: 4 mmol/L (ref 3.5–5.1)
Sodium: 141 mmol/L (ref 135–145)
TCO2: 25 mmol/L (ref 22–32)
pCO2 arterial: 34.2 mmHg (ref 32.0–48.0)
pH, Arterial: 7.457 — ABNORMAL HIGH (ref 7.350–7.450)
pO2, Arterial: 86 mmHg (ref 83.0–108.0)

## 2018-05-09 LAB — BASIC METABOLIC PANEL
Anion gap: 9 (ref 5–15)
BUN: 20 mg/dL (ref 8–23)
CO2: 26 mmol/L (ref 22–32)
Calcium: 8.8 mg/dL — ABNORMAL LOW (ref 8.9–10.3)
Chloride: 106 mmol/L (ref 98–111)
Creatinine, Ser: 1.27 mg/dL — ABNORMAL HIGH (ref 0.61–1.24)
GFR calc Af Amer: >60 mL/min (ref >60)
GFR calc non Af Amer: 56 mL/min — ABNORMAL LOW (ref >60)
Glucose, Bld: 93 mg/dL (ref 70–99)
Potassium: 3.9 mmol/L (ref 3.5–5.1)
Sodium: 141 mmol/L (ref 135–145)

## 2018-05-09 LAB — POCT I-STAT EG7
Acid-Base Excess: 1 mmol/L (ref 0.0–2.0)
Bicarbonate: 26.2 mmol/L (ref 20.0–28.0)
Calcium, Ion: 1.19 mmol/L (ref 1.15–1.40)
HCT: 39 % (ref 39.0–52.0)
Hemoglobin: 13.3 g/dL (ref 13.0–17.0)
O2 Saturation: 70 %
Potassium: 3.9 mmol/L (ref 3.5–5.1)
Sodium: 141 mmol/L (ref 135–145)
TCO2: 27 mmol/L (ref 22–32)
pCO2, Ven: 43.2 mmHg — ABNORMAL LOW (ref 44.0–60.0)
pH, Ven: 7.39 (ref 7.250–7.430)
pO2, Ven: 37 mmHg (ref 32.0–45.0)

## 2018-05-09 LAB — CBC
HCT: 43.1 % (ref 39.0–52.0)
Hemoglobin: 14.1 g/dL (ref 13.0–17.0)
MCH: 29.5 pg (ref 26.0–34.0)
MCHC: 32.7 g/dL (ref 30.0–36.0)
MCV: 90.2 fL (ref 80.0–100.0)
Platelets: 156 10*3/uL (ref 150–400)
RBC: 4.78 MIL/uL (ref 4.22–5.81)
RDW: 13 % (ref 11.5–15.5)
WBC: 6 10*3/uL (ref 4.0–10.5)
nRBC: 0 % (ref 0.0–0.2)

## 2018-05-09 LAB — BLOOD GAS, ARTERIAL
Acid-base deficit: 0.3 mmol/L (ref 0.0–2.0)
Bicarbonate: 23.5 mmol/L (ref 20.0–28.0)
Drawn by: 50222
FIO2: 21
O2 Saturation: 95.1 %
Patient temperature: 98.6
pCO2 arterial: 36 mmHg (ref 32.0–48.0)
pH, Arterial: 7.429 (ref 7.350–7.450)
pO2, Arterial: 74.8 mmHg — ABNORMAL LOW (ref 83.0–108.0)

## 2018-05-09 LAB — PULMONARY FUNCTION TEST
FEF 25-75 Pre: 1.91 L/sec
FEF2575-%Pred-Pre: 80 %
FEV1-%Pred-Pre: 59 %
FEV1-Pre: 1.89 L
FEV1FVC-%Pred-Pre: 110 %
FEV6-%Pred-Pre: 57 %
FEV6-Pre: 2.35 L
FEV6FVC-%Pred-Pre: 106 %
FVC-%Pred-Pre: 53 %
FVC-Pre: 2.35 L
Pre FEV1/FVC ratio: 81 %
Pre FEV6/FVC Ratio: 100 %

## 2018-05-09 LAB — CREATININE, SERUM
Creatinine, Ser: 1.33 mg/dL — ABNORMAL HIGH (ref 0.61–1.24)
GFR calc Af Amer: >60 mL/min (ref >60)
GFR calc non Af Amer: 53 mL/min — ABNORMAL LOW (ref >60)

## 2018-05-09 LAB — POCT ACTIVATED CLOTTING TIME: Activated Clotting Time: 186 seconds

## 2018-05-09 LAB — ABO/RH: ABO/RH(D): A POS

## 2018-05-09 SURGERY — RIGHT/LEFT HEART CATH AND CORONARY ANGIOGRAPHY
Anesthesia: LOCAL

## 2018-05-09 MED ORDER — SODIUM CHLORIDE 0.9% FLUSH
3.0000 mL | Freq: Two times a day (BID) | INTRAVENOUS | Status: DC
  Administered 2018-05-09: 3 mL via INTRAVENOUS

## 2018-05-09 MED ORDER — FUROSEMIDE 40 MG PO TABS
40.0000 mg | ORAL_TABLET | Freq: Every day | ORAL | Status: DC
  Administered 2018-05-09: 40 mg via ORAL
  Filled 2018-05-09: qty 1

## 2018-05-09 MED ORDER — PHENYLEPHRINE HCL-NACL 20-0.9 MG/250ML-% IV SOLN
30.0000 ug/min | INTRAVENOUS | Status: AC
  Administered 2018-05-10: 25 ug/min via INTRAVENOUS
  Filled 2018-05-09: qty 250

## 2018-05-09 MED ORDER — SODIUM CHLORIDE 0.9 % IV SOLN
INTRAVENOUS | Status: AC
  Administered 2018-05-09: 12:00:00 via INTRAVENOUS

## 2018-05-09 MED ORDER — VERAPAMIL HCL 2.5 MG/ML IV SOLN
INTRAVENOUS | Status: AC
  Filled 2018-05-09: qty 2

## 2018-05-09 MED ORDER — INSULIN REGULAR(HUMAN) IN NACL 100-0.9 UT/100ML-% IV SOLN
INTRAVENOUS | Status: AC
  Administered 2018-05-10: .9 [IU]/h via INTRAVENOUS
  Filled 2018-05-09: qty 100

## 2018-05-09 MED ORDER — DOPAMINE-DEXTROSE 3.2-5 MG/ML-% IV SOLN
0.0000 ug/kg/min | INTRAVENOUS | Status: AC
  Administered 2018-05-10: 3 ug/kg/min via INTRAVENOUS
  Filled 2018-05-09: qty 250

## 2018-05-09 MED ORDER — TRANEXAMIC ACID 1000 MG/10ML IV SOLN
1.5000 mg/kg/h | INTRAVENOUS | Status: AC
  Administered 2018-05-10: 1.5 mg/kg/h via INTRAVENOUS
  Filled 2018-05-09: qty 25

## 2018-05-09 MED ORDER — ONDANSETRON HCL 4 MG/2ML IJ SOLN: 4.0000 mg | Freq: Four times a day (QID) | INTRAMUSCULAR | Status: DC | PRN

## 2018-05-09 MED ORDER — CHLORHEXIDINE GLUCONATE 0.12 % MT SOLN
15.0000 mL | Freq: Once | OROMUCOSAL | Status: AC
  Administered 2018-05-10: 15 mL via OROMUCOSAL
  Filled 2018-05-09: qty 15

## 2018-05-09 MED ORDER — TEMAZEPAM 15 MG PO CAPS: 15.0000 mg | ORAL_CAPSULE | Freq: Once | ORAL | Status: DC | PRN

## 2018-05-09 MED ORDER — MIDAZOLAM HCL 2 MG/2ML IJ SOLN
INTRAMUSCULAR | Status: AC
  Filled 2018-05-09: qty 2

## 2018-05-09 MED ORDER — HEPARIN (PORCINE) IN NACL 1000-0.9 UT/500ML-% IV SOLN
INTRAVENOUS | Status: AC
  Filled 2018-05-09: qty 1000

## 2018-05-09 MED ORDER — HEPARIN SODIUM (PORCINE) 5000 UNIT/ML IJ SOLN
5000.0000 [IU] | Freq: Three times a day (TID) | INTRAMUSCULAR | Status: DC
  Filled 2018-05-09: qty 1

## 2018-05-09 MED ORDER — SODIUM CHLORIDE 0.9% FLUSH: 3.0000 mL | INTRAVENOUS | Status: DC | PRN

## 2018-05-09 MED ORDER — SODIUM CHLORIDE 0.9 % IV SOLN
750.0000 mg | INTRAVENOUS | Status: AC
  Administered 2018-05-10: .75 g via INTRAVENOUS
  Filled 2018-05-09: qty 750

## 2018-05-09 MED ORDER — MIDAZOLAM HCL 2 MG/2ML IJ SOLN
INTRAMUSCULAR | Status: DC | PRN
  Administered 2018-05-09 (×2): 1 mg via INTRAVENOUS

## 2018-05-09 MED ORDER — FENTANYL CITRATE (PF) 100 MCG/2ML IJ SOLN
INTRAMUSCULAR | Status: AC
  Filled 2018-05-09: qty 2

## 2018-05-09 MED ORDER — POTASSIUM CHLORIDE 2 MEQ/ML IV SOLN
80.0000 meq | INTRAVENOUS | Status: DC
  Filled 2018-05-09: qty 40

## 2018-05-09 MED ORDER — VERAPAMIL HCL 2.5 MG/ML IV SOLN
INTRAVENOUS | Status: DC | PRN
  Administered 2018-05-09: 10 mL via INTRA_ARTERIAL

## 2018-05-09 MED ORDER — HEPARIN (PORCINE) IN NACL 1000-0.9 UT/500ML-% IV SOLN
INTRAVENOUS | Status: DC | PRN
  Administered 2018-05-09: 500 mL

## 2018-05-09 MED ORDER — NOREPINEPHRINE 4 MG/250ML-% IV SOLN
0.0000 ug/min | INTRAVENOUS | Status: AC
  Administered 2018-05-10: 2 ug/min via INTRAVENOUS
  Filled 2018-05-09: qty 250

## 2018-05-09 MED ORDER — TRANEXAMIC ACID (OHS) BOLUS VIA INFUSION
15.0000 mg/kg | INTRAVENOUS | Status: AC
  Administered 2018-05-10: 1476 mg via INTRAVENOUS
  Filled 2018-05-09: qty 1476

## 2018-05-09 MED ORDER — TRANEXAMIC ACID (OHS) PUMP PRIME SOLUTION
2.0000 mg/kg | INTRAVENOUS | Status: DC
  Filled 2018-05-09: qty 1.97

## 2018-05-09 MED ORDER — SODIUM CHLORIDE 0.9 % IV SOLN
1.5000 g | INTRAVENOUS | Status: AC
  Administered 2018-05-10: 1.5 g via INTRAVENOUS
  Filled 2018-05-09: qty 1.5

## 2018-05-09 MED ORDER — LIDOCAINE HCL (PF) 1 % IJ SOLN
INTRAMUSCULAR | Status: DC | PRN
  Administered 2018-05-09: 10 mL
  Administered 2018-05-09 (×2): 2 mL

## 2018-05-09 MED ORDER — HEPARIN SODIUM (PORCINE) 1000 UNIT/ML IJ SOLN
INTRAMUSCULAR | Status: DC | PRN
  Administered 2018-05-09: 5000 [IU] via INTRAVENOUS

## 2018-05-09 MED ORDER — NITROGLYCERIN IN D5W 200-5 MCG/ML-% IV SOLN
2.0000 ug/min | INTRAVENOUS | Status: DC
  Filled 2018-05-09: qty 250

## 2018-05-09 MED ORDER — FENTANYL CITRATE (PF) 100 MCG/2ML IJ SOLN
INTRAMUSCULAR | Status: DC | PRN
  Administered 2018-05-09: 25 ug via INTRAVENOUS

## 2018-05-09 MED ORDER — SODIUM CHLORIDE 0.9 % IV SOLN
INTRAVENOUS | Status: DC
  Filled 2018-05-09: qty 30

## 2018-05-09 MED ORDER — IOHEXOL 350 MG/ML SOLN
INTRAVENOUS | Status: DC | PRN
  Administered 2018-05-09: 95 mL via INTRA_ARTERIAL

## 2018-05-09 MED ORDER — DEXMEDETOMIDINE HCL IN NACL 400 MCG/100ML IV SOLN
0.1000 ug/kg/h | INTRAVENOUS | Status: AC
  Administered 2018-05-10: .3 ug/kg/h via INTRAVENOUS
  Filled 2018-05-09: qty 100

## 2018-05-09 MED ORDER — ASPIRIN 81 MG PO CHEW: 81.0000 mg | CHEWABLE_TABLET | Freq: Every day | ORAL | Status: DC

## 2018-05-09 MED ORDER — BISACODYL 5 MG PO TBEC
5.0000 mg | DELAYED_RELEASE_TABLET | Freq: Once | ORAL | Status: AC
  Administered 2018-05-09: 5 mg via ORAL
  Filled 2018-05-09: qty 1

## 2018-05-09 MED ORDER — SODIUM CHLORIDE 0.9 % IV SOLN: 250.0000 mL | INTRAVENOUS | Status: DC | PRN

## 2018-05-09 MED ORDER — VANCOMYCIN HCL 10 G IV SOLR
1500.0000 mg | INTRAVENOUS | Status: AC
  Administered 2018-05-10: 1500 mg via INTRAVENOUS
  Filled 2018-05-09: qty 1500

## 2018-05-09 MED ORDER — HEPARIN SODIUM (PORCINE) 1000 UNIT/ML IJ SOLN
INTRAMUSCULAR | Status: AC
  Filled 2018-05-09: qty 1

## 2018-05-09 MED ORDER — PLASMA-LYTE 148 IV SOLN
INTRAVENOUS | Status: DC
  Filled 2018-05-09: qty 2.5

## 2018-05-09 MED ORDER — VANCOMYCIN HCL 1000 MG IV SOLR
INTRAVENOUS | Status: DC
  Filled 2018-05-09: qty 1000

## 2018-05-09 MED ORDER — ATORVASTATIN CALCIUM 80 MG PO TABS
80.0000 mg | ORAL_TABLET | Freq: Every day | ORAL | Status: DC
  Administered 2018-05-09: 80 mg via ORAL
  Filled 2018-05-09: qty 1

## 2018-05-09 MED ORDER — LIDOCAINE HCL (PF) 1 % IJ SOLN
INTRAMUSCULAR | Status: AC
  Filled 2018-05-09: qty 30

## 2018-05-09 MED ORDER — MAGNESIUM SULFATE 50 % IJ SOLN
40.0000 meq | INTRAMUSCULAR | Status: DC
  Filled 2018-05-09: qty 9.85

## 2018-05-09 MED ORDER — CHLORHEXIDINE GLUCONATE 4 % EX LIQD
60.0000 mL | Freq: Once | CUTANEOUS | Status: AC
  Administered 2018-05-09: 4 via TOPICAL
  Filled 2018-05-09: qty 60

## 2018-05-09 MED ORDER — MILRINONE LACTATE IN DEXTROSE 20-5 MG/100ML-% IV SOLN
0.3000 ug/kg/min | INTRAVENOUS | Status: AC
  Administered 2018-05-10: .3 ug/kg/min via INTRAVENOUS
  Filled 2018-05-09: qty 100

## 2018-05-09 MED ORDER — EPINEPHRINE PF 1 MG/ML IJ SOLN
0.0000 ug/min | INTRAVENOUS | Status: DC
  Filled 2018-05-09: qty 4

## 2018-05-09 MED ORDER — METOPROLOL TARTRATE 12.5 MG HALF TABLET
12.5000 mg | ORAL_TABLET | Freq: Once | ORAL | Status: AC
  Administered 2018-05-10: 12.5 mg via ORAL
  Filled 2018-05-09: qty 1

## 2018-05-09 MED ORDER — CHLORHEXIDINE GLUCONATE 4 % EX LIQD
60.0000 mL | Freq: Once | CUTANEOUS | Status: AC
  Administered 2018-05-10: 4 via TOPICAL
  Filled 2018-05-09: qty 60

## 2018-05-09 MED ORDER — ACETAMINOPHEN 325 MG PO TABS: 650.0000 mg | ORAL_TABLET | ORAL | Status: DC | PRN

## 2018-05-09 SURGICAL SUPPLY — 17 items
CATH INFINITI 5 FR JL3.5 (CATHETERS) ×2 IMPLANT
CATH INFINITI JR4 5F (CATHETERS) ×2 IMPLANT
CATH LAUNCHER 5F EBU3.0 (CATHETERS) ×1 IMPLANT
CATH SWAN GANZ 7F STRAIGHT (CATHETERS) ×2 IMPLANT
CATHETER LAUNCHER 5F EBU3.0 (CATHETERS) ×2
DEVICE RAD COMP TR BAND LRG (VASCULAR PRODUCTS) ×2 IMPLANT
GLIDESHEATH SLEND A-KIT 6F 22G (SHEATH) ×2 IMPLANT
GUIDEWIRE INQWIRE 1.5J.035X260 (WIRE) ×1 IMPLANT
INQWIRE 1.5J .035X260CM (WIRE) ×2
KIT HEART LEFT (KITS) ×2 IMPLANT
PACK CARDIAC CATHETERIZATION (CUSTOM PROCEDURE TRAY) ×2 IMPLANT
SHEATH GLIDE SLENDER 4/5FR (SHEATH) IMPLANT
SHEATH PINNACLE 7F 10CM (SHEATH) ×2 IMPLANT
SHEATH PROBE COVER 6X72 (BAG) ×2 IMPLANT
TRANSDUCER W/STOPCOCK (MISCELLANEOUS) ×2 IMPLANT
TUBING CIL FLEX 10 FLL-RA (TUBING) ×2 IMPLANT
WIRE HI TORQ VERSACORE-J 145CM (WIRE) ×2 IMPLANT

## 2018-05-09 NOTE — Progress Notes (Signed)
Progress Note  Patient Name: Brian [name] Date of Encounter: 05/09/2018  Primary Cardiologist: Lewayne BuntingGregg Taylor, MD   Subjective   Feeling better.  Concerned about needing CABG.   Inpatient Medications    Scheduled Meds: . aspirin EC  81 mg Oral Daily  . atorvastatin  80 mg Oral q1800  . carvedilol  12.5 mg Oral BID WC  . cycloSPORINE  1 drop Both Eyes BID  . [START ON 05/10/2018] heparin  5,000 Units Subcutaneous Q8H  . multivitamin  1 tablet Oral Daily  . sacubitril-valsartan  1 tablet Oral BID  . sodium chloride flush  3 mL Intravenous Once  . sodium chloride flush  3 mL Intravenous Q12H  . sodium chloride flush  3 mL Intravenous Q12H  . vitamin C  1,000 mg Oral Daily   Continuous Infusions: . sodium chloride    . sodium chloride 100 mL/hr at 05/09/18 1138  . sodium chloride     PRN Meds: sodium chloride, sodium chloride, acetaminophen, ondansetron (ZOFRAN) IV, sodium chloride flush, sodium chloride flush   Vital Signs    Vitals:   05/09/18 1103 05/09/18 1105 05/09/18 1110 05/09/18 1132  BP: 112/70 112/70 120/77 110/76  Pulse: 67 63 69 62  Resp: 18 17 (!) 23 20  Temp:    97.7 F (36.5 C)  TempSrc:    Oral  SpO2: 97% 96% 96% 97%  Weight:      Height:        Intake/Output Summary (Last 24 hours) at 05/09/2018 1218 Last data filed at 05/09/2018 0827 Gross per 24 hour  Intake 306 ml  Output 200 ml  Net 106 ml   Last 3 Weights 05/09/2018 05/08/2018 05/08/2018  Weight (lbs) 217 lb 215 lb 4.8 oz 220 lb  Weight (kg) 98.431 kg 97.659 kg 99.791 kg      Telemetry    APVP - Personally Reviewed  ECG    n/a - Personally Reviewed  Physical Exam   VS:  BP 110/76 (BP Location: Left Arm)   Pulse 62   Temp 97.7 F (36.5 C) (Oral)   Resp 20   Ht 5\' 10"  (1.778 m)   Wt 98.4 kg   SpO2 97%   BMI 31.14 kg/m  , BMI Body mass index is 31.14 kg/m. GENERAL:  Well appearing HEENT: Pupils equal round and reactive, fundi not visualized, oral mucosa unremarkable NECK:   No jugular venous distention, waveform within normal limits, carotid upstroke brisk and symmetric, no bruits LUNGS:  Clear to auscultation bilaterally HEART:  RRR.  PMI not displaced or sustained,S1 and S2 within normal limits, no S3, no S4, no clicks, no rubs, no murmurs ABD:  Flat, positive bowel sounds normal in frequency in pitch, no bruits, no rebound, no guarding, no midline pulsatile mass, no hepatomegaly, no splenomegaly EXT:  2 plus pulses throughout, 2+ LE edema, no cyanosis no clubbing.  R TR band in place SKIN:  No rashes no nodules NEURO:  Cranial nerves II through XII grossly intact, motor grossly intact throughout [facility]:  Cognitively intact, oriented to person place and time   Labs    Chemistry Recent Labs  Lab 05/03/18 1224 05/07/18 2346 05/09/18 0417  NA 140 140 141  K 4.1 3.9 3.9  CL 100 106 106  CO2 22 23 26   GLUCOSE 92 107* 93  BUN 17 20 20   CREATININE 1.22 1.54* 1.27*  CALCIUM 8.9 8.9 8.8*  GFRNONAA 59* 44* 56*  GFRAA 68 51* >60  ANIONGAP  --  11 9     Hematology Recent Labs  Lab 05/03/18 1224 05/07/18 2346  WBC 7.5 5.9  RBC 4.59 4.40  HGB 14.1 13.2  HCT 40.3 40.6  MCV 88 92.3  MCH 30.7 30.0  MCHC 35.0 32.5  RDW 13.2 13.1  PLT 207 159    Cardiac EnzymesNo results for input(s): TROPONINI in the last 168 hours.  Recent Labs  Lab 05/07/18 2340  TROPIPOC 0.02     BNP Recent Labs  Lab 05/07/18 2346  BNP 1,006.3*     DDimer No results for input(s): DDIMER in the last 168 hours.   Radiology    Dg Chest 2 View  Result Date: 05/07/2018 CLINICAL DATA:  Chest pain EXAM: CHEST - 2 VIEW COMPARISON:  04/25/2018, 09/09/2017 FINDINGS: Left-sided pacing device similar in position. Cardiomegaly. Vascular congestion with mild pulmonary edema. Elevated left diaphragm with atelectasis at the left base. No pneumothorax. There are tiny pleural effusions. IMPRESSION: 1. Cardiomegaly with vascular congestion and mild interstitial edema, tiny pleural  effusions. 2. Elevated left diaphragm with atelectasis at the left base. Electronically Signed   By: Jasmine Pang M.D.   On: 05/07/2018 23:57    Cardiac Studies   LHC 05/09/18:   Severe calcific three-vessel coronary disease involving the nondominant right coronary, the mid circumflex and 2 large obtuse marginal branches, and the mid segment of the LAD and proximal portion of the first diagonal.  Global left ventricular hypokinesis.  Will need to differentiate if this is ischemically mediated versus pacemaker related decline.  The patient has shortness of breath which could be an ischemic equivalent.  Normal pulmonary artery pressures.  RECOMMENDATIONS:   Surgical revascularization should be considered.  Not a good candidate MRI viability study due to indwelling device.  RHC: RA 8, RV 37/6, PA 40/15, PA mean 24, PCWP 15  Echo 04/19/18: Study Conclusions  - Left ventricle: The cavity size was mildly dilated. Systolic   function was moderately to severely reduced. The estimated   ejection fraction was in the range of 30% to 35%. Doppler   parameters are consistent with abnormal left ventricular   relaxation (grade 1 diastolic dysfunction). - Aortic valve: Transvalvular velocity was increased. There was   mild stenosis. Valve area (VTI): 1.85 cm^2. Valve area (Vmax):   2.03 cm^2. Valve area (Vmean): 2.18 cm^2. - Mitral valve: There was moderate regurgitation. - Right ventricle: Systolic function was reduced.  Impressions:  - Very difficult study.   Images are of poor technical quality even with Definity contrast.   Evaluation is further impeded by incessant ectopy.     There appears to be a marked reduction in left ventricular   systolic function and mild dilation of the left ventricle   compared to June 2019 (those images were also of poor quality).     At times, LV dysfunction appears regional (disproportionate   hypokinesis of the inferior septum and apex), but  interpretation   is made with low level of confidence. Pacing and/or PVC-related   dyssynchrony may be causing artifactual regional variation.   Patient Profile     [name] is a 20M with chronic systolic and diastolic heart failure here with an exacerbation.  Assessment & Plan    # Acute on chronic systolic and diastolic heart failure: LVEF newly reduced to 30-35%.  This is in the setting of chronic RV pacing since his device was implanted 08/2017.  However, cath this admission also shows 3 vessel CAD.  CT surgery has been  consulted.  Will ask EP to see him and weigh in on BiV ICD.  I suspect they will want until after he is revascularized to see if there is an improvement in LVEF.  He is feeling much better with diuresis.  Losartan was switched to [facility].  RA pressure was 8 on RHC.  Will start lasix 40mg  po daily. He has LE edema that is likely 2/2 venous stasis.  Will apply Ted hose.    # CAD:  3 vessel CAD seen on cath as above.  Continue aspirin, atovastatin and carvedilol.  CT surgery consult pending.   For questions or updates, please contact [facility] Please consult [email or web address] for contact info under        Signed, Chilton Si, MD  05/09/2018, 12:18 PM  '

## 2018-05-09 NOTE — Progress Notes (Signed)
Venous sheath removed at 1100. Manual pressure held and vital signs monitored. No difficulty, hematomas, or bleeding noted from site. Bedrest to begin at 1110. Vitals signs WNL and patient will be transferred back to 3E.

## 2018-05-09 NOTE — Research (Signed)
[facility] Informed Consent   Subject Name: Brian [name]  Subject met inclusion and exclusion criteria.  The informed consent form, study requirements and expectations were reviewed with the subject and questions and concerns were addressed prior to the signing of the consent form.  The subject verbalized understanding of the trail requirements.  The subject agreed to participate in the [facility] trial and signed the informed consent.  The informed consent was obtained prior to performance of any protocol-specific procedures for the subject.  A copy of the signed informed consent was given to the subject and a copy was placed in the subject's medical record.  Yes No Inclusion  [x]  []  Institutional Review Board (IRB)/Independent Ethics Committee (IEC)-approved written informed consent and privacy language as per national regulations (e.g., [place] for [place] sites) must be obtained from the subject or legally authorized representative prior to any study-related procedures (including withdrawal of prohibited medication).  [x]  []  Subject agrees not to participate in another interventional study after signing the informed consent form (ICF) and until the end of study (EoS) visit has been completed.   [x]  []  Subject is ? 73 years of age at time of screening (visit   [x]  []  Subject undergoing non-emergent open chest cardiovascular surgery with use of CPB (i.e., CABG and/or valve surgery [including aortic root and ascending aorta surgery, without circulatory arrest]) within 4 weeks of screening (visit 1).  [x]  []  Subject has moderate/high risk of developing AKI following surgery: [x]  Only CABG, or single valve surgery: subject requires to have at least 2 AKI risk factors at screening [] Combined CABG and surgery for 1 or more valves: subject requires to have at least 1 AKI risk factor at screening [] Surgery for more than 1 cardiac valve: subject  requires to have at least 1 AKI risk factor at screening [] Surgery for aortic root or ascending aorta without circulatory arrest: subject requires to have at least 1 AKI risk factor at screening  AKI risk factors: [x]  Age at screening of ? 70 years [x]  Documented history of eGFR < 60 mL/min per 1.73 m2 as per Modification of Diet in Renal Disease Study (MDRD) or CKD-EPI equation (or documented measured GFR assessment) [] o History needs to be present for at least 90 days prior to screening. Both SCr and eGFR need to be documented in the chart, and [x] o eGFR at screening or baseline needs to be < 60 mL/min per 1.73 m2, as well as per CKD-EPI equation. []  Documented history of congestive heart failure requiring hospitalization. This condition should exist for at least 90 days prior to screening. []  Documented history of diabetes mellitus (type 1 or 2) of ? 90 days prior to screening, and subject is on active antidiabetic medication treatment for ? 90 days. []  Documented history of proteinuria/albuminuria at any time point before screening [] o Urinary dipstick result of ? 2+, OR [] o Documented urinary albumin creatinine ratio measurement of ? 300 mg/g, or [] o Documented total quantity of protein in a 24-hour urine collection test ? 0.3 g/day.  [x]  []  Subject must have the ability, in the opinion of the investigator, and willingness to return for all scheduled visits and perform all assessments.  []  []  A male subject is eligible to participate if she is not pregnant see [Appendix 12.3 Contraception Requirements] and at least 1 of the following conditions applies: []  Not a woman of childbearing potential ([place]) as defined in [Appendix 12.3 Contraception Requirements] OR []  WOCBP who agrees  to follow the contraceptive guidance as defined in [Appendix 12.3 Contraception Requirements] throughout the treatment period and for at least 30 days after the final study drug administration.  []  []  Male  subject must agree not to breastfeed starting at screening and throughout the study period, and for 30 days after the final study drug administration.  []  []  Male subject must not donate ova starting at screening and throughout the study period, and for 30 days after the final study drug administration.  []  []  A male subject with male partner(s) of childbearing potential must agree to use contraception as detailed in [Appendix 12.3 Contraception Requirements] during the treatment period and for at least 30 days after the final study drug administration.  []  []  A male subject must not donate sperm during the treatment period and for at least 30 days after the final study drug administration.  []  []  Male subject with a pregnant or breastfeeding partner(s) must agree to remain abstinent or use a condom for the duration of the pregnancy or time partner is breastfeeding throughout the study period and for 30 days after the final study drug administration.    Exclusion  []  [x]  Subject has received investigational drug within 30 days or 5 half-lives, whichever is longer, prior to screening.  []  [x]  Subject has use of RRT within 30 days prior to screening.  []  [x]  Subject has a known history of eGFR < 30 mL/min per 1.73 m2 as per CKD-EPI or MDRD equation at any time before screening.  []  [x]  Subject has a prior kidney transplantation.  []  [x]  Subject has a known or suspected glomerulonephritis (other than Diabetic Kidney Disease).  []  [x]  Subject has confirmed or treated endocarditis, or other current active infection requiring antibiotic treatment, within 30 days prior to screening.  []  [x]  Subject is using prohibited medications as specified in the concomitant medication section of the protocol [Section 5.1.3.1 Prohibited and Restricted Treatment].  []  [x]  Subject has a prior history of intravenous drug abuse within 1 year prior to screening.  []  [x]  Subject has a known chronic liver disorder with  Child-Pugh B or C classification.  []  [x]  Subject has any of the following abnormal liver or kidney function parameters (as assessed in visit 1 sample. If 1 of results of central or local laboratory testing fulfill the criterion, the subjects will be eligible.): []  Alanine aminotransferase (ALT), aspartate aminotransferase (AST) to > 2 times the upper limit of normal (ULN) or bilirubin increased to > 1.5 times the ULN. []  eGFR < 30 mL/min per 1.73 m2 as per CKD-EPI equation.  []  [x]  Subject has use of left ventricular assist device (LVAD), intra-aortic balloon pump (IABP) or other cardiac devices, or catecholamines within 7 days prior to screening.  []  [x]  Subject has surgery scheduled to be performed without CPB (i.e., "Off-Pump" surgery).  []  [x]  Subject has surgery scheduled to be performed under conditions of circulatory arrest, including planned deep hypothermic circulatory arrest.  []  [x]  Subject has surgery scheduled for aortic dissection.  []  [x]  Subject has surgery for a condition that is immediately life-threatening as per the discretion of the investigator.  []  [x]  Subject has surgery scheduled to correct major congenital heart defect.   []  [x]  Subject has current or previous malignant disease. Subjects with a history of cancer are considered eligible if the subject has undergone therapy and the subject has been considered disease free or progression free for at least 5 years. Subject with completely excised basal cell carcinoma or  squamous cell carcinoma of the skin and completely excised cervical cancer in situ are also considered eligible.    Preoperatively at the Day of Surgery:  []  []  Exclusion criteria 1 to 17 are applicable at this time.  []  []  Subject has AKI (any stage) present at presurgery baseline at the discretion of the investigator.  []  []  Subject has known or suspected infection/sepsis at time of presurgery baseline at the discretion of investigator.    Perioperative  Exclusion Criteria:  []  []  Subject requires Extracorporeal Membrane Oxygenation (ECMO) during or after completion of surgery.  []  []  Subject requires ventricular assist device (VAD) during or after completion of surgery.  []  []  Subject has surgery performed "Off-Pump" at any time during surgery.    General:  []  [x]  Subject has other condition, which, in the investigator's opinion, makes the subject unsuitable for study participation.  []  [x]  Male subject who is pregnant or lactating or has a positive pregnancy test within 72 hours prior to screening and/or randomization, has been pregnant within 6 months before screening assessment or breastfeeding within 3 months before screening or who is planning to become pregnant within the total study period.  []  [x]  Subject has a known or suspected hypersensitivity to [id number] or any components of the formulation used.  []  [x]  Subject is an employee of the [employer] or the [employer]) involved in the study.    Akili Cuda 05/09/2018, 5:12 PM

## 2018-05-09 NOTE — Anesthesia Preprocedure Evaluation (Addendum)
Anesthesia Evaluation  Patient identified by MRN, date of birth, ID band Patient awake    Reviewed: Allergy & Precautions, H&P , NPO status , Patient's Chart, lab work & pertinent test results, reviewed documented beta blocker date and time   Airway Mallampati: III  TM Distance: >3 FB Neck ROM: Full    Dental no notable dental hx. (+) Teeth Intact, Dental Advisory Given   Pulmonary asthma , former smoker,    Pulmonary exam normal breath sounds clear to auscultation       Cardiovascular Exercise Tolerance: Good hypertension, Pt. on medications and Pt. on home beta blockers + CAD and +CHF  Normal cardiovascular exam+ pacemaker  Rhythm:Regular Rate:Normal     Neuro/Psych negative neurological ROS  negative psych ROS   GI/Hepatic negative GI ROS, Neg liver ROS,   Endo/Other  negative endocrine ROS  Renal/GU Renal InsufficiencyRenal disease     Musculoskeletal  (+) Arthritis , Osteoarthritis,    Abdominal (+) + obese,   Peds  Hematology negative hematology ROS (+)   Anesthesia Other Findings   Reproductive/Obstetrics                          Anesthesia Physical Anesthesia Plan  ASA: IV  Anesthesia Plan: General   Post-op Pain Management:    Induction: Intravenous  PONV Risk Score and Plan: 3 and Midazolam, Ondansetron and Treatment may vary due to age or medical condition  Airway Management Planned: Oral ETT  Additional Equipment: Arterial line, CVP, PA Cath, TEE and Ultrasound Guidance Line Placement  Intra-op Plan:   Post-operative Plan: Post-operative intubation/ventilation  Informed Consent: I have reviewed the patients History and Physical, chart, labs and discussed the procedure including the risks, benefits and alternatives for the proposed anesthesia with the patient or authorized representative who has indicated his/her understanding and acceptance.     Dental advisory  given  Plan Discussed with: CRNA  Anesthesia Plan Comments:        Anesthesia Quick Evaluation

## 2018-05-09 NOTE — Progress Notes (Signed)
The consent for the cardiac cath is obtained, patient has a second IV, he is shaved both groins and radial. Patient is now awake so I called the number and advice him to watch the procedure's video.

## 2018-05-09 NOTE — Progress Notes (Signed)
Pre-CABG Dopplers completed. Refer to "CV Proc" under chart review to view preliminary results.  05/09/2018 4:26 PM Gertie Fey, MHA, RVT, RDCS, RDMS

## 2018-05-09 NOTE — Interval H&P Note (Signed)
Cath Lab Visit (complete for each Cath Lab visit)  Clinical Evaluation Leading to the Procedure:   ACS: No.  Non-ACS:    Anginal Classification: CCS III  Anti-ischemic medical therapy: Minimal Therapy (1 class of medications)  Non-Invasive Test Results: No non-invasive testing performed  Prior CABG: No previous CABG      History and Physical Interval Note:  05/09/2018 9:13 AM  [name]  has presented today for surgery, with the diagnosis of Cardiomyopathy  The various methods of treatment have been discussed with the patient and family. After consideration of risks, benefits and other options for treatment, the patient has consented to  Procedure(s): RIGHT/LEFT HEART CATH AND CORONARY ANGIOGRAPHY (N/A) as a surgical intervention .  The patient's history has been reviewed, patient examined, no change in status, stable for surgery.  I have reviewed the patient's chart and labs.  Questions were answered to the patient's satisfaction.     Lyn Records III

## 2018-05-09 NOTE — Progress Notes (Signed)
TCTS consulted for CABG evaluation. °
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
## 2018-05-10 ENCOUNTER — Observation Stay (HOSPITAL_COMMUNITY): Payer: Medicare Other

## 2018-05-10 ENCOUNTER — Inpatient Hospital Stay (HOSPITAL_COMMUNITY): Payer: Medicare Other

## 2018-05-10 ENCOUNTER — Inpatient Hospital Stay (HOSPITAL_COMMUNITY)
Admission: EM | Disposition: A | Discharge status: Home / Self Care | Payer: Self-pay | Source: Home / Self Care | Attending: Thoracic Surgery (Cardiothoracic Vascular Surgery)

## 2018-05-10 ENCOUNTER — Observation Stay (HOSPITAL_COMMUNITY): Payer: Medicare Other | Admitting: Certified Registered Nurse Anesthetist

## 2018-05-10 DIAGNOSIS — I130 Hypertensive heart and chronic kidney disease with heart failure and stage 1 through stage 4 chronic kidney disease, or unspecified chronic kidney disease: Secondary | ICD-10-CM | POA: Diagnosis present

## 2018-05-10 DIAGNOSIS — D62 Acute posthemorrhagic anemia: Secondary | ICD-10-CM | POA: Diagnosis not present

## 2018-05-10 DIAGNOSIS — Z888 Allergy status to other drugs, medicaments and biological substances status: Secondary | ICD-10-CM | POA: Diagnosis not present

## 2018-05-10 DIAGNOSIS — N183 Chronic kidney disease, stage 3 (moderate): Secondary | ICD-10-CM | POA: Diagnosis present

## 2018-05-10 DIAGNOSIS — I2510 Atherosclerotic heart disease of native coronary artery without angina pectoris: Secondary | ICD-10-CM | POA: Diagnosis present

## 2018-05-10 DIAGNOSIS — I442 Atrioventricular block, complete: Secondary | ICD-10-CM | POA: Diagnosis present

## 2018-05-10 DIAGNOSIS — Z79899 Other long term (current) drug therapy: Secondary | ICD-10-CM | POA: Diagnosis not present

## 2018-05-10 DIAGNOSIS — N170 Acute kidney failure with tubular necrosis: Secondary | ICD-10-CM | POA: Diagnosis present

## 2018-05-10 DIAGNOSIS — I5023 Acute on chronic systolic (congestive) heart failure: Secondary | ICD-10-CM | POA: Diagnosis not present

## 2018-05-10 DIAGNOSIS — I5043 Acute on chronic combined systolic (congestive) and diastolic (congestive) heart failure: Secondary | ICD-10-CM | POA: Diagnosis present

## 2018-05-10 DIAGNOSIS — R001 Bradycardia, unspecified: Secondary | ICD-10-CM | POA: Diagnosis present

## 2018-05-10 DIAGNOSIS — Z951 Presence of aortocoronary bypass graft: Secondary | ICD-10-CM

## 2018-05-10 DIAGNOSIS — Z6830 Body mass index (BMI) 30.0-30.9, adult: Secondary | ICD-10-CM | POA: Diagnosis not present

## 2018-05-10 DIAGNOSIS — I255 Ischemic cardiomyopathy: Secondary | ICD-10-CM | POA: Diagnosis present

## 2018-05-10 DIAGNOSIS — Z885 Allergy status to narcotic agent status: Secondary | ICD-10-CM | POA: Diagnosis not present

## 2018-05-10 DIAGNOSIS — Z7982 Long term (current) use of aspirin: Secondary | ICD-10-CM | POA: Diagnosis not present

## 2018-05-10 DIAGNOSIS — I4892 Unspecified atrial flutter: Secondary | ICD-10-CM | POA: Diagnosis not present

## 2018-05-10 DIAGNOSIS — E669 Obesity, unspecified: Secondary | ICD-10-CM | POA: Diagnosis present

## 2018-05-10 DIAGNOSIS — J9811 Atelectasis: Secondary | ICD-10-CM | POA: Diagnosis not present

## 2018-05-10 DIAGNOSIS — H9190 Unspecified hearing loss, unspecified ear: Secondary | ICD-10-CM | POA: Diagnosis present

## 2018-05-10 DIAGNOSIS — Z950 Presence of cardiac pacemaker: Secondary | ICD-10-CM | POA: Diagnosis not present

## 2018-05-10 DIAGNOSIS — M1990 Unspecified osteoarthritis, unspecified site: Secondary | ICD-10-CM | POA: Diagnosis present

## 2018-05-10 DIAGNOSIS — J449 Chronic obstructive pulmonary disease, unspecified: Secondary | ICD-10-CM | POA: Diagnosis present

## 2018-05-10 DIAGNOSIS — Z87891 Personal history of nicotine dependence: Secondary | ICD-10-CM | POA: Diagnosis not present

## 2018-05-10 HISTORY — PX: CORONARY ARTERY BYPASS GRAFT: SHX141

## 2018-05-10 HISTORY — PX: TEE WITHOUT CARDIOVERSION: SHX5443

## 2018-05-10 LAB — POCT I-STAT 7, (LYTES, BLD GAS, ICA,H+H)
Acid-base deficit: 1 mmol/L (ref 0.0–2.0)
Acid-base deficit: 4 mmol/L — ABNORMAL HIGH (ref 0.0–2.0)
Acid-base deficit: 6 mmol/L — ABNORMAL HIGH (ref 0.0–2.0)
Acid-base deficit: 5 mmol/L — ABNORMAL HIGH (ref 0.0–2.0)
Bicarbonate: 23.6 mmol/L (ref 20.0–28.0)
Bicarbonate: 21.9 mmol/L (ref 20.0–28.0)
Bicarbonate: 19.8 mmol/L — ABNORMAL LOW (ref 20.0–28.0)
Bicarbonate: 21.7 mmol/L (ref 20.0–28.0)
Calcium, Ion: 0.95 mmol/L — ABNORMAL LOW (ref 1.15–1.40)
Calcium, Ion: 1.18 mmol/L (ref 1.15–1.40)
Calcium, Ion: 1.15 mmol/L (ref 1.15–1.40)
Calcium, Ion: 1.19 mmol/L (ref 1.15–1.40)
HCT: 29 % — ABNORMAL LOW (ref 39.0–52.0)
HCT: 36 % — ABNORMAL LOW (ref 39.0–52.0)
HCT: 32 % — ABNORMAL LOW (ref 39.0–52.0)
HCT: 33 % — ABNORMAL LOW (ref 39.0–52.0)
Hemoglobin: 9.9 g/dL — ABNORMAL LOW (ref 13.0–17.0)
Hemoglobin: 12.2 g/dL — ABNORMAL LOW (ref 13.0–17.0)
Hemoglobin: 10.9 g/dL — ABNORMAL LOW (ref 13.0–17.0)
Hemoglobin: 11.2 g/dL — ABNORMAL LOW (ref 13.0–17.0)
O2 Saturation: 100 %
O2 Saturation: 90 %
O2 Saturation: 95 %
O2 Saturation: 91 %
Patient temperature: 36
Patient temperature: 36
Patient temperature: 36.2
Potassium: 4 mmol/L (ref 3.5–5.1)
Potassium: 4.1 mmol/L (ref 3.5–5.1)
Potassium: 4.2 mmol/L (ref 3.5–5.1)
Potassium: 4.6 mmol/L (ref 3.5–5.1)
Sodium: 141 mmol/L (ref 135–145)
Sodium: 141 mmol/L (ref 135–145)
Sodium: 142 mmol/L (ref 135–145)
Sodium: 141 mmol/L (ref 135–145)
TCO2: 25 mmol/L (ref 22–32)
TCO2: 23 mmol/L (ref 22–32)
TCO2: 21 mmol/L — ABNORMAL LOW (ref 22–32)
TCO2: 23 mmol/L (ref 22–32)
pCO2 arterial: 37.3 mmHg (ref 32.0–48.0)
pCO2 arterial: 41.8 mmHg (ref 32.0–48.0)
pCO2 arterial: 36.7 mmHg (ref 32.0–48.0)
pCO2 arterial: 45.5 mmHg (ref 32.0–48.0)
pH, Arterial: 7.408 (ref 7.350–7.450)
pH, Arterial: 7.322 — ABNORMAL LOW (ref 7.350–7.450)
pH, Arterial: 7.336 — ABNORMAL LOW (ref 7.350–7.450)
pH, Arterial: 7.283 — ABNORMAL LOW (ref 7.350–7.450)
pO2, Arterial: 272 mmHg — ABNORMAL HIGH (ref 83.0–108.0)
pO2, Arterial: 60 mmHg — ABNORMAL LOW (ref 83.0–108.0)
pO2, Arterial: 73 mmHg — ABNORMAL LOW (ref 83.0–108.0)
pO2, Arterial: 65 mmHg — ABNORMAL LOW (ref 83.0–108.0)

## 2018-05-10 LAB — CBC
HCT: 45.7 % (ref 39.0–52.0)
HCT: 38.8 % — ABNORMAL LOW (ref 39.0–52.0)
HCT: 34.6 % — ABNORMAL LOW (ref 39.0–52.0)
Hemoglobin: 15.1 g/dL (ref 13.0–17.0)
Hemoglobin: 12.9 g/dL — ABNORMAL LOW (ref 13.0–17.0)
Hemoglobin: 11.3 g/dL — ABNORMAL LOW (ref 13.0–17.0)
MCH: 30.3 pg (ref 26.0–34.0)
MCH: 30.5 pg (ref 26.0–34.0)
MCH: 29.7 pg (ref 26.0–34.0)
MCHC: 33 g/dL (ref 30.0–36.0)
MCHC: 33.2 g/dL (ref 30.0–36.0)
MCHC: 32.7 g/dL (ref 30.0–36.0)
MCV: 91.6 fL (ref 80.0–100.0)
MCV: 91.7 fL (ref 80.0–100.0)
MCV: 91.1 fL (ref 80.0–100.0)
Platelets: 191 10*3/uL (ref 150–400)
Platelets: 119 10*3/uL — ABNORMAL LOW (ref 150–400)
Platelets: 120 10*3/uL — ABNORMAL LOW (ref 150–400)
RBC: 4.99 MIL/uL (ref 4.22–5.81)
RBC: 4.23 MIL/uL (ref 4.22–5.81)
RBC: 3.8 MIL/uL — ABNORMAL LOW (ref 4.22–5.81)
RDW: 13.2 % (ref 11.5–15.5)
RDW: 13.1 % (ref 11.5–15.5)
RDW: 12.9 % (ref 11.5–15.5)
WBC: 7.7 10*3/uL (ref 4.0–10.5)
WBC: 13.2 10*3/uL — ABNORMAL HIGH (ref 4.0–10.5)
WBC: 15.3 10*3/uL — ABNORMAL HIGH (ref 4.0–10.5)
nRBC: 0 % (ref 0.0–0.2)
nRBC: 0 % (ref 0.0–0.2)
nRBC: 0 % (ref 0.0–0.2)

## 2018-05-10 LAB — PROTIME-INR
INR: 1.39
Prothrombin Time: 16.9 seconds — ABNORMAL HIGH (ref 11.4–15.2)

## 2018-05-10 LAB — POCT I-STAT 4, (NA,K, GLUC, HGB,HCT)
Glucose, Bld: 105 mg/dL — ABNORMAL HIGH (ref 70–99)
Glucose, Bld: 117 mg/dL — ABNORMAL HIGH (ref 70–99)
Glucose, Bld: 120 mg/dL — ABNORMAL HIGH (ref 70–99)
Glucose, Bld: 129 mg/dL — ABNORMAL HIGH (ref 70–99)
HCT: 39 % (ref 39.0–52.0)
HCT: 37 % — ABNORMAL LOW (ref 39.0–52.0)
HCT: 30 % — ABNORMAL LOW (ref 39.0–52.0)
HCT: 31 % — ABNORMAL LOW (ref 39.0–52.0)
Hemoglobin: 13.3 g/dL (ref 13.0–17.0)
Hemoglobin: 12.6 g/dL — ABNORMAL LOW (ref 13.0–17.0)
Hemoglobin: 10.2 g/dL — ABNORMAL LOW (ref 13.0–17.0)
Hemoglobin: 10.5 g/dL — ABNORMAL LOW (ref 13.0–17.0)
Potassium: 3.8 mmol/L (ref 3.5–5.1)
Potassium: 4.1 mmol/L (ref 3.5–5.1)
Potassium: 4.1 mmol/L (ref 3.5–5.1)
Potassium: 4.7 mmol/L (ref 3.5–5.1)
Sodium: 141 mmol/L (ref 135–145)
Sodium: 140 mmol/L (ref 135–145)
Sodium: 137 mmol/L (ref 135–145)
Sodium: 141 mmol/L (ref 135–145)

## 2018-05-10 LAB — URINALYSIS, ROUTINE W REFLEX MICROSCOPIC
Bilirubin Urine: NEGATIVE
Glucose, UA: NEGATIVE mg/dL
Hgb urine dipstick: NEGATIVE
Ketones, ur: NEGATIVE mg/dL
Leukocytes,Ua: NEGATIVE
Nitrite: NEGATIVE
Protein, ur: NEGATIVE mg/dL
Specific Gravity, Urine: 1.014 (ref 1.005–1.030)
pH: 6 (ref 5.0–8.0)

## 2018-05-10 LAB — GLUCOSE, CAPILLARY
Glucose-Capillary: 112 mg/dL — ABNORMAL HIGH (ref 70–99)
Glucose-Capillary: 134 mg/dL — ABNORMAL HIGH (ref 70–99)
Glucose-Capillary: 132 mg/dL — ABNORMAL HIGH (ref 70–99)
Glucose-Capillary: 132 mg/dL — ABNORMAL HIGH (ref 70–99)
Glucose-Capillary: 120 mg/dL — ABNORMAL HIGH (ref 70–99)
Glucose-Capillary: 124 mg/dL — ABNORMAL HIGH (ref 70–99)
Glucose-Capillary: 114 mg/dL — ABNORMAL HIGH (ref 70–99)
Glucose-Capillary: 112 mg/dL — ABNORMAL HIGH (ref 70–99)
Glucose-Capillary: 117 mg/dL — ABNORMAL HIGH (ref 70–99)
Glucose-Capillary: 117 mg/dL — ABNORMAL HIGH (ref 70–99)
Glucose-Capillary: 122 mg/dL — ABNORMAL HIGH (ref 70–99)
Glucose-Capillary: 123 mg/dL — ABNORMAL HIGH (ref 70–99)

## 2018-05-10 LAB — HEMOGLOBIN AND HEMATOCRIT, BLOOD
HCT: 33 % — ABNORMAL LOW (ref 39.0–52.0)
Hemoglobin: 10.9 g/dL — ABNORMAL LOW (ref 13.0–17.0)

## 2018-05-10 LAB — BASIC METABOLIC PANEL
Anion gap: 13 (ref 5–15)
Anion gap: 7 (ref 5–15)
BUN: 18 mg/dL (ref 8–23)
BUN: 13 mg/dL (ref 8–23)
CO2: 24 mmol/L (ref 22–32)
CO2: 24 mmol/L (ref 22–32)
Calcium: 8.9 mg/dL (ref 8.9–10.3)
Calcium: 8 mg/dL — ABNORMAL LOW (ref 8.9–10.3)
Chloride: 104 mmol/L (ref 98–111)
Chloride: 111 mmol/L (ref 98–111)
Creatinine, Ser: 1.5 mg/dL — ABNORMAL HIGH (ref 0.61–1.24)
Creatinine, Ser: 1.13 mg/dL (ref 0.61–1.24)
GFR calc Af Amer: 53 mL/min — ABNORMAL LOW (ref >60)
GFR calc Af Amer: >60 mL/min (ref >60)
GFR calc non Af Amer: 46 mL/min — ABNORMAL LOW (ref >60)
GFR calc non Af Amer: >60 mL/min (ref >60)
Glucose, Bld: 116 mg/dL — ABNORMAL HIGH (ref 70–99)
Glucose, Bld: 115 mg/dL — ABNORMAL HIGH (ref 70–99)
Potassium: 4.2 mmol/L (ref 3.5–5.1)
Potassium: 4.5 mmol/L (ref 3.5–5.1)
Sodium: 141 mmol/L (ref 135–145)
Sodium: 142 mmol/L (ref 135–145)

## 2018-05-10 LAB — MAGNESIUM: Magnesium: 2.9 mg/dL — ABNORMAL HIGH (ref 1.7–2.4)

## 2018-05-10 LAB — APTT
aPTT: 31 seconds (ref 24–36)
aPTT: 34 seconds (ref 24–36)

## 2018-05-10 LAB — PREPARE RBC (CROSSMATCH)

## 2018-05-10 LAB — SURGICAL PCR SCREEN
MRSA, PCR: NEGATIVE
Staphylococcus aureus: NEGATIVE

## 2018-05-10 LAB — PLATELET COUNT: Platelets: 113 10*3/uL — ABNORMAL LOW (ref 150–400)

## 2018-05-10 SURGERY — CORONARY ARTERY BYPASS GRAFTING (CABG)
Anesthesia: General | Site: Chest

## 2018-05-10 MED ORDER — PHENYLEPHRINE HCL-NACL 20-0.9 MG/250ML-% IV SOLN
0.0000 ug/min | INTRAVENOUS | Status: DC
  Administered 2018-05-10: 15 ug/min via INTRAVENOUS

## 2018-05-10 MED ORDER — ONDANSETRON HCL 4 MG/2ML IJ SOLN
INTRAMUSCULAR | Status: AC
  Filled 2018-05-10: qty 2

## 2018-05-10 MED ORDER — ALBUMIN HUMAN 5 % IV SOLN
INTRAVENOUS | Status: DC | PRN
  Administered 2018-05-10 (×2): via INTRAVENOUS

## 2018-05-10 MED ORDER — SODIUM CHLORIDE 0.9 % IV SOLN
INTRAVENOUS | Status: AC
  Administered 2018-05-10: 13:00:00 via INTRAVENOUS

## 2018-05-10 MED ORDER — POTASSIUM CHLORIDE 10 MEQ/50ML IV SOLN: 10.0000 meq | INTRAVENOUS | Status: AC

## 2018-05-10 MED ORDER — NOREPINEPHRINE 4 MG/250ML-% IV SOLN
0.0000 ug/min | INTRAVENOUS | Status: DC
  Administered 2018-05-10: 4 ug/min via INTRAVENOUS
  Administered 2018-05-10: 8 ug/min via INTRAVENOUS
  Filled 2018-05-10: qty 250

## 2018-05-10 MED ORDER — MORPHINE SULFATE (PF) 2 MG/ML IV SOLN
1.0000 mg | INTRAVENOUS | Status: DC | PRN
  Administered 2018-05-10 (×2): 2 mg via INTRAVENOUS
  Administered 2018-05-10: 4 mg via INTRAVENOUS
  Administered 2018-05-10 – 2018-05-12 (×11): 2 mg via INTRAVENOUS
  Filled 2018-05-10 (×13): qty 1
  Filled 2018-05-10: qty 2
  Filled 2018-05-10: qty 1

## 2018-05-10 MED ORDER — MAGNESIUM SULFATE 4 GM/100ML IV SOLN
4.0000 g | Freq: Once | INTRAVENOUS | Status: AC
  Administered 2018-05-10: 4 g via INTRAVENOUS
  Filled 2018-05-10: qty 100

## 2018-05-10 MED ORDER — EPHEDRINE 5 MG/ML INJ
INTRAVENOUS | Status: AC
  Filled 2018-05-10: qty 10

## 2018-05-10 MED ORDER — SODIUM CHLORIDE (PF) 0.9 % IJ SOLN
OROMUCOSAL | Status: DC | PRN
  Administered 2018-05-10 (×3): 4 mL via TOPICAL

## 2018-05-10 MED ORDER — LACTATED RINGERS IV SOLN
INTRAVENOUS | Status: DC
  Administered 2018-05-10: 15:00:00 via INTRAVENOUS

## 2018-05-10 MED ORDER — ACETAMINOPHEN 160 MG/5ML PO SOLN: 650.0000 mg | Freq: Once | ORAL | Status: AC

## 2018-05-10 MED ORDER — SODIUM CHLORIDE 0.9 % IV SOLN
1.5000 g | Freq: Two times a day (BID) | INTRAVENOUS | Status: AC
  Administered 2018-05-10 – 2018-05-12 (×4): 1.5 g via INTRAVENOUS
  Filled 2018-05-10 (×4): qty 1.5

## 2018-05-10 MED ORDER — HEPARIN SODIUM (PORCINE) 1000 UNIT/ML IJ SOLN
INTRAMUSCULAR | Status: AC
  Filled 2018-05-10: qty 1

## 2018-05-10 MED ORDER — PLASMA-LYTE 148 IV SOLN
INTRAVENOUS | Status: DC | PRN
  Administered 2018-05-10: 500 mL via INTRAVASCULAR

## 2018-05-10 MED ORDER — SODIUM CHLORIDE 0.9 % IV SOLN
250.0000 mL | INTRAVENOUS | Status: DC
  Administered 2018-05-14: 250 mL via INTRAVENOUS

## 2018-05-10 MED ORDER — FAMOTIDINE IN NACL 20-0.9 MG/50ML-% IV SOLN
20.0000 mg | Freq: Two times a day (BID) | INTRAVENOUS | Status: DC
  Administered 2018-05-10: 20 mg via INTRAVENOUS
  Filled 2018-05-10: qty 50

## 2018-05-10 MED ORDER — NOREPINEPHRINE 4 MG/250ML-% IV SOLN
0.0000 ug/min | INTRAVENOUS | Status: AC
  Administered 2018-05-10: 5 ug/min via INTRAVENOUS

## 2018-05-10 MED ORDER — PROPOFOL 10 MG/ML IV BOLUS
INTRAVENOUS | Status: DC | PRN
  Administered 2018-05-10: 50 mg via INTRAVENOUS

## 2018-05-10 MED ORDER — MIDAZOLAM HCL 5 MG/5ML IJ SOLN
INTRAMUSCULAR | Status: DC | PRN
  Administered 2018-05-10: 2 mg via INTRAVENOUS
  Administered 2018-05-10: 3 mg via INTRAVENOUS
  Administered 2018-05-10: 2 mg via INTRAVENOUS
  Administered 2018-05-10: 3 mg via INTRAVENOUS

## 2018-05-10 MED ORDER — METOPROLOL TARTRATE 5 MG/5ML IV SOLN: 2.5000 mg | INTRAVENOUS | Status: DC | PRN

## 2018-05-10 MED ORDER — FENTANYL CITRATE (PF) 250 MCG/5ML IJ SOLN
INTRAMUSCULAR | Status: DC | PRN
  Administered 2018-05-10: 100 ug via INTRAVENOUS
  Administered 2018-05-10 (×2): 150 ug via INTRAVENOUS
  Administered 2018-05-10: 100 ug via INTRAVENOUS
  Administered 2018-05-10 (×2): 250 ug via INTRAVENOUS

## 2018-05-10 MED ORDER — ONDANSETRON HCL 4 MG/2ML IJ SOLN
INTRAMUSCULAR | Status: DC | PRN
  Administered 2018-05-10: 4 mg via INTRAVENOUS

## 2018-05-10 MED ORDER — ASPIRIN 81 MG PO CHEW: 324.0000 mg | CHEWABLE_TABLET | Freq: Every day | ORAL | Status: DC

## 2018-05-10 MED ORDER — VANCOMYCIN HCL IN DEXTROSE 1-5 GM/200ML-% IV SOLN
1000.0000 mg | Freq: Once | INTRAVENOUS | Status: AC
  Administered 2018-05-10: 1000 mg via INTRAVENOUS

## 2018-05-10 MED ORDER — 0.9 % SODIUM CHLORIDE (POUR BTL) OPTIME
TOPICAL | Status: DC | PRN
  Administered 2018-05-10: 5000 mL

## 2018-05-10 MED ORDER — MIDAZOLAM HCL 2 MG/2ML IJ SOLN
2.0000 mg | INTRAMUSCULAR | Status: DC | PRN
  Administered 2018-05-10: 2 mg via INTRAVENOUS
  Filled 2018-05-10: qty 2

## 2018-05-10 MED ORDER — PROTAMINE SULFATE 10 MG/ML IV SOLN
INTRAVENOUS | Status: AC
  Filled 2018-05-10: qty 25

## 2018-05-10 MED ORDER — NITROGLYCERIN IN D5W 200-5 MCG/ML-% IV SOLN: 0.0000 ug/min | INTRAVENOUS | Status: DC

## 2018-05-10 MED ORDER — INSULIN REGULAR(HUMAN) IN NACL 100-0.9 UT/100ML-% IV SOLN
INTRAVENOUS | Status: DC
  Administered 2018-05-10: 1.5 [IU]/h via INTRAVENOUS

## 2018-05-10 MED ORDER — PROPOFOL 10 MG/ML IV BOLUS
INTRAVENOUS | Status: AC
  Filled 2018-05-10: qty 20

## 2018-05-10 MED ORDER — ACETAMINOPHEN 160 MG/5ML PO SOLN: 1000.0000 mg | Freq: Four times a day (QID) | ORAL | Status: DC

## 2018-05-10 MED ORDER — ALBUMIN HUMAN 5 % IV SOLN
250.0000 mL | INTRAVENOUS | Status: DC | PRN
  Administered 2018-05-10 (×2): 12.5 g via INTRAVENOUS

## 2018-05-10 MED ORDER — ASPIRIN EC 325 MG PO TBEC
325.0000 mg | DELAYED_RELEASE_TABLET | Freq: Every day | ORAL | Status: DC
  Administered 2018-05-11 – 2018-05-15 (×5): 325 mg via ORAL
  Filled 2018-05-10 (×5): qty 1

## 2018-05-10 MED ORDER — PHENYLEPHRINE 40 MCG/ML (10ML) SYRINGE FOR IV PUSH (FOR BLOOD PRESSURE SUPPORT)
PREFILLED_SYRINGE | INTRAVENOUS | Status: AC
  Filled 2018-05-10: qty 30

## 2018-05-10 MED ORDER — PROTAMINE SULFATE 10 MG/ML IV SOLN
INTRAVENOUS | Status: DC | PRN
  Administered 2018-05-10: 300 mg via INTRAVENOUS

## 2018-05-10 MED ORDER — FENTANYL CITRATE (PF) 250 MCG/5ML IJ SOLN
INTRAMUSCULAR | Status: AC
  Filled 2018-05-10: qty 5

## 2018-05-10 MED ORDER — PROTAMINE SULFATE 10 MG/ML IV SOLN
INTRAVENOUS | Status: AC
  Filled 2018-05-10: qty 10

## 2018-05-10 MED ORDER — CHLORHEXIDINE GLUCONATE 0.12 % MT SOLN
15.0000 mL | OROMUCOSAL | Status: AC
  Administered 2018-05-10: 15 mL via OROMUCOSAL

## 2018-05-10 MED ORDER — LACTATED RINGERS IV SOLN
INTRAVENOUS | Status: DC | PRN
  Administered 2018-05-10: 07:00:00 via INTRAVENOUS

## 2018-05-10 MED ORDER — DEXMEDETOMIDINE HCL IN NACL 400 MCG/100ML IV SOLN
0.0000 ug/kg/h | INTRAVENOUS | Status: DC
  Administered 2018-05-10 (×2): 0.7 ug/kg/h via INTRAVENOUS
  Filled 2018-05-10: qty 100

## 2018-05-10 MED ORDER — OXYCODONE HCL 5 MG PO TABS
5.0000 mg | ORAL_TABLET | ORAL | Status: DC | PRN
  Administered 2018-05-10 – 2018-05-12 (×4): 10 mg via ORAL
  Administered 2018-05-14: 5 mg via ORAL
  Filled 2018-05-10: qty 2
  Filled 2018-05-10: qty 1
  Filled 2018-05-10 (×3): qty 2

## 2018-05-10 MED ORDER — SODIUM CHLORIDE 0.9% FLUSH
3.0000 mL | Freq: Two times a day (BID) | INTRAVENOUS | Status: DC
  Administered 2018-05-11: 3 mL via INTRAVENOUS
  Administered 2018-05-12: 10 mL via INTRAVENOUS
  Administered 2018-05-12 – 2018-05-15 (×6): 3 mL via INTRAVENOUS

## 2018-05-10 MED ORDER — ROCURONIUM BROMIDE 50 MG/5ML IV SOSY
PREFILLED_SYRINGE | INTRAVENOUS | Status: AC
  Filled 2018-05-10: qty 20

## 2018-05-10 MED ORDER — VANCOMYCIN HCL 1000 MG IV SOLR
INTRAVENOUS | Status: DC | PRN
  Administered 2018-05-10: 1000 mL

## 2018-05-10 MED ORDER — BISACODYL 10 MG RE SUPP: 10.0000 mg | Freq: Every day | RECTAL | Status: DC

## 2018-05-10 MED ORDER — PHENYLEPHRINE 40 MCG/ML (10ML) SYRINGE FOR IV PUSH (FOR BLOOD PRESSURE SUPPORT)
PREFILLED_SYRINGE | INTRAVENOUS | Status: AC
  Filled 2018-05-10: qty 10

## 2018-05-10 MED ORDER — LACTATED RINGERS IV SOLN: INTRAVENOUS | Status: DC

## 2018-05-10 MED ORDER — EPHEDRINE SULFATE 50 MG/ML IJ SOLN
INTRAMUSCULAR | Status: DC | PRN
  Administered 2018-05-10: 5 mg via INTRAVENOUS

## 2018-05-10 MED ORDER — MIDAZOLAM HCL (PF) 10 MG/2ML IJ SOLN
INTRAMUSCULAR | Status: AC
  Filled 2018-05-10: qty 2

## 2018-05-10 MED ORDER — PHENYLEPHRINE HCL 10 MG/ML IJ SOLN
INTRAMUSCULAR | Status: DC | PRN
  Administered 2018-05-10 (×2): 80 ug via INTRAVENOUS
  Administered 2018-05-10: 40 ug via INTRAVENOUS
  Administered 2018-05-10: 80 ug via INTRAVENOUS
  Administered 2018-05-10: 40 ug via INTRAVENOUS
  Administered 2018-05-10 (×2): 120 ug via INTRAVENOUS
  Administered 2018-05-10: 200 ug via INTRAVENOUS
  Administered 2018-05-10: 120 ug via INTRAVENOUS
  Administered 2018-05-10 (×2): 40 ug via INTRAVENOUS

## 2018-05-10 MED ORDER — ORAL CARE MOUTH RINSE
15.0000 mL | Freq: Two times a day (BID) | OROMUCOSAL | Status: DC
  Administered 2018-05-10 – 2018-05-17 (×12): 15 mL via OROMUCOSAL

## 2018-05-10 MED ORDER — SODIUM CHLORIDE 0.9 % IV SOLN
INTRAVENOUS | Status: DC
  Administered 2018-05-10 – 2018-05-12 (×3): via INTRAVENOUS

## 2018-05-10 MED ORDER — SODIUM BICARBONATE 8.4 % IV SOLN
50.0000 meq | Freq: Once | INTRAVENOUS | Status: AC
  Administered 2018-05-10: 50 meq via INTRAVENOUS

## 2018-05-10 MED ORDER — ROCURONIUM BROMIDE 10 MG/ML (PF) SYRINGE
PREFILLED_SYRINGE | INTRAVENOUS | Status: DC | PRN
  Administered 2018-05-10 (×4): 50 mg via INTRAVENOUS

## 2018-05-10 MED ORDER — SODIUM CHLORIDE 0.45 % IV SOLN
INTRAVENOUS | Status: DC | PRN
  Administered 2018-05-10: 13:00:00 via INTRAVENOUS

## 2018-05-10 MED ORDER — LACTATED RINGERS IV SOLN
INTRAVENOUS | Status: DC | PRN
  Administered 2018-05-10: 08:00:00 via INTRAVENOUS

## 2018-05-10 MED ORDER — FENTANYL CITRATE (PF) 250 MCG/5ML IJ SOLN
INTRAMUSCULAR | Status: AC
  Filled 2018-05-10: qty 15

## 2018-05-10 MED ORDER — DOPAMINE-DEXTROSE 3.2-5 MG/ML-% IV SOLN
0.0000 ug/kg/min | INTRAVENOUS | Status: DC
  Administered 2018-05-10: 2 ug/kg/min via INTRAVENOUS

## 2018-05-10 MED ORDER — PANTOPRAZOLE SODIUM 40 MG PO TBEC
40.0000 mg | DELAYED_RELEASE_TABLET | Freq: Every day | ORAL | Status: DC
  Administered 2018-05-12 – 2018-05-17 (×6): 40 mg via ORAL
  Filled 2018-05-10 (×6): qty 1

## 2018-05-10 MED ORDER — ACETAMINOPHEN 650 MG RE SUPP
650.0000 mg | Freq: Once | RECTAL | Status: AC
  Administered 2018-05-10: 650 mg via RECTAL

## 2018-05-10 MED ORDER — HEPARIN SODIUM (PORCINE) 1000 UNIT/ML IJ SOLN
INTRAMUSCULAR | Status: DC | PRN
  Administered 2018-05-10: 32000 [IU] via INTRAVENOUS

## 2018-05-10 MED ORDER — INSULIN REGULAR BOLUS VIA INFUSION
0.0000 [IU] | Freq: Three times a day (TID) | INTRAVENOUS | Status: DC
  Filled 2018-05-10: qty 10

## 2018-05-10 MED ORDER — BISACODYL 5 MG PO TBEC
10.0000 mg | DELAYED_RELEASE_TABLET | Freq: Every day | ORAL | Status: DC
  Administered 2018-05-11 – 2018-05-17 (×6): 10 mg via ORAL
  Filled 2018-05-10 (×6): qty 2

## 2018-05-10 MED ORDER — SODIUM CHLORIDE 0.9% IV SOLUTION: Freq: Once | INTRAVENOUS | Status: DC

## 2018-05-10 MED ORDER — TRAMADOL HCL 50 MG PO TABS
50.0000 mg | ORAL_TABLET | ORAL | Status: DC | PRN
  Administered 2018-05-10: 100 mg via ORAL
  Filled 2018-05-10: qty 2

## 2018-05-10 MED ORDER — MILRINONE LACTATE IN DEXTROSE 20-5 MG/100ML-% IV SOLN
0.0000 ug/kg/min | INTRAVENOUS | Status: DC
  Administered 2018-05-10: 0.3 ug/kg/min via INTRAVENOUS
  Administered 2018-05-12 – 2018-05-14 (×3): 0.2 ug/kg/min via INTRAVENOUS
  Administered 2018-05-14: 0.1 ug/kg/min via INTRAVENOUS
  Filled 2018-05-10 (×7): qty 100

## 2018-05-10 MED ORDER — ACETAMINOPHEN 500 MG PO TABS
1000.0000 mg | ORAL_TABLET | Freq: Four times a day (QID) | ORAL | Status: AC
  Administered 2018-05-10 – 2018-05-15 (×14): 1000 mg via ORAL
  Filled 2018-05-10 (×14): qty 2

## 2018-05-10 MED ORDER — DOCUSATE SODIUM 100 MG PO CAPS
200.0000 mg | ORAL_CAPSULE | Freq: Every day | ORAL | Status: DC
  Administered 2018-05-11 – 2018-05-17 (×6): 200 mg via ORAL
  Filled 2018-05-10 (×6): qty 2

## 2018-05-10 MED ORDER — LACTATED RINGERS IV SOLN
500.0000 mL | Freq: Once | INTRAVENOUS | Status: AC | PRN
  Administered 2018-05-10: 500 mL via INTRAVENOUS

## 2018-05-10 MED ORDER — ONDANSETRON HCL 4 MG/2ML IJ SOLN
4.0000 mg | Freq: Four times a day (QID) | INTRAMUSCULAR | Status: DC | PRN
  Administered 2018-05-10 – 2018-05-12 (×3): 4 mg via INTRAVENOUS
  Filled 2018-05-10 (×3): qty 2

## 2018-05-10 MED ORDER — SODIUM CHLORIDE 0.9% FLUSH: 3.0000 mL | INTRAVENOUS | Status: DC | PRN

## 2018-05-10 SURGICAL SUPPLY — 123 items
ADAPTER CARDIO PERF ANTE/RETRO (ADAPTER) ×3 IMPLANT
BAG DECANTER FOR FLEXI CONT (MISCELLANEOUS) ×6 IMPLANT
BANDAGE ACE 4X5 VEL STRL LF (GAUZE/BANDAGES/DRESSINGS) ×3 IMPLANT
BANDAGE ACE 6X5 VEL STRL LF (GAUZE/BANDAGES/DRESSINGS) ×3 IMPLANT
BANDAGE ELASTIC 4 VELCRO ST LF (GAUZE/BANDAGES/DRESSINGS) ×3 IMPLANT
BANDAGE ELASTIC 6 VELCRO ST LF (GAUZE/BANDAGES/DRESSINGS) ×3 IMPLANT
BASKET HEART (ORDER IN 25'S) (MISCELLANEOUS) ×1
BASKET HEART (ORDER IN 25S) (MISCELLANEOUS) ×2 IMPLANT
BLADE CLIPPER SURG (BLADE) ×3 IMPLANT
BLADE STERNUM SYSTEM 6 (BLADE) ×3 IMPLANT
BNDG GAUZE ELAST 4 BULKY (GAUZE/BANDAGES/DRESSINGS) ×3 IMPLANT
CABLE SURGICAL S-101-97-12 (CABLE) ×3 IMPLANT
CANISTER SUCT 3000ML PPV (MISCELLANEOUS) ×3 IMPLANT
CANNULA EZ GLIDE AORTIC 21FR (CANNULA) ×3 IMPLANT
CANNULA FEM VENOUS REMOTE 22FR (CANNULA) ×3 IMPLANT
CANNULA GUNDRY RCSP 15FR (MISCELLANEOUS) ×3 IMPLANT
CANNULA VRC MALB SNGL STG 24FR (MISCELLANEOUS) ×2 IMPLANT
CATH CPB KIT OWEN (MISCELLANEOUS) ×3 IMPLANT
CATH THORACIC 36FR (CATHETERS) ×3 IMPLANT
CLIP RETRACTION 3.0MM CORONARY (MISCELLANEOUS) ×6 IMPLANT
CLIP VESOCCLUDE MED 24/CT (CLIP) IMPLANT
CLIP VESOCCLUDE SM WIDE 24/CT (CLIP) IMPLANT
CONN ST 1/4X3/8  BEN (MISCELLANEOUS) ×2
CONN ST 1/4X3/8 BEN (MISCELLANEOUS) ×4 IMPLANT
CONNECTOR 1/2X3/8X1/2 3 WAY (MISCELLANEOUS) ×1
CONNECTOR 1/2X3/8X1/2 3WAY (MISCELLANEOUS) ×2 IMPLANT
COVER PROBE W GEL 5X96 (DRAPES) ×3 IMPLANT
COVER WAND RF STERILE (DRAPES) ×3 IMPLANT
CRADLE DONUT ADULT HEAD (MISCELLANEOUS) ×3 IMPLANT
DERMABOND ADVANCED (GAUZE/BANDAGES/DRESSINGS) ×1
DERMABOND ADVANCED .7 DNX12 (GAUZE/BANDAGES/DRESSINGS) ×2 IMPLANT
DRAIN CHANNEL 32F RND 10.7 FF (WOUND CARE) ×6 IMPLANT
DRAPE CARDIOVASCULAR INCISE (DRAPES) ×1
DRAPE INCISE IOBAN 66X45 STRL (DRAPES) ×3 IMPLANT
DRAPE SLUSH/WARMER DISC (DRAPES) ×3 IMPLANT
DRAPE SRG 135X102X78XABS (DRAPES) ×2 IMPLANT
DRSG AQUACEL AG ADV 3.5X14 (GAUZE/BANDAGES/DRESSINGS) ×3 IMPLANT
DRSG COVADERM 4X14 (GAUZE/BANDAGES/DRESSINGS) ×3 IMPLANT
ELECT BLADE 4.0 EZ CLEAN MEGAD (MISCELLANEOUS) ×3
ELECT REM PT RETURN 9FT ADLT (ELECTROSURGICAL) ×6
ELECTRODE BLDE 4.0 EZ CLN MEGD (MISCELLANEOUS) ×2 IMPLANT
ELECTRODE REM PT RTRN 9FT ADLT (ELECTROSURGICAL) ×4 IMPLANT
FELT TEFLON 1X6 (MISCELLANEOUS) ×6 IMPLANT
GAUZE SPONGE 4X4 12PLY STRL (GAUZE/BANDAGES/DRESSINGS) ×6 IMPLANT
GLOVE BIO SURGEON STRL SZ 6 (GLOVE) ×9 IMPLANT
GLOVE BIO SURGEON STRL SZ7 (GLOVE) ×9 IMPLANT
GLOVE BIO SURGEON STRL SZ7.5 (GLOVE) ×6 IMPLANT
GLOVE BIOGEL PI IND STRL 6 (GLOVE) ×8 IMPLANT
GLOVE BIOGEL PI INDICATOR 6 (GLOVE) ×4
GLOVE ORTHO TXT STRL SZ7.5 (GLOVE) ×6 IMPLANT
GOWN STRL REUS W/ TWL LRG LVL3 (GOWN DISPOSABLE) ×16 IMPLANT
GOWN STRL REUS W/TWL LRG LVL3 (GOWN DISPOSABLE) ×8
HEMOSTAT POWDER SURGIFOAM 1G (HEMOSTASIS) ×9 IMPLANT
HEMOSTAT SURGICEL 2X14 (HEMOSTASIS) ×3 IMPLANT
INSERT FOGARTY XLG (MISCELLANEOUS) ×3 IMPLANT
KIT BASIN OR (CUSTOM PROCEDURE TRAY) ×3 IMPLANT
KIT DILATOR VASC 18G NDL (KITS) ×3 IMPLANT
KIT DRAINAGE VACCUM ASSIST (KITS) ×3 IMPLANT
KIT SUCTION CATH 14FR (SUCTIONS) ×9 IMPLANT
KIT TURNOVER KIT B (KITS) ×3 IMPLANT
KIT VASOVIEW HEMOPRO 2 VH 4000 (KITS) ×3 IMPLANT
LEAD PACING EPI (Prosthesis & Implant Heart) ×6 IMPLANT
LEAD PACING MYOCARDI (MISCELLANEOUS) ×3 IMPLANT
LINE EXTENSION DELIVERY (MISCELLANEOUS) ×3 IMPLANT
LINE VENT (MISCELLANEOUS) ×3 IMPLANT
MARKER GRAFT CORONARY BYPASS (MISCELLANEOUS) ×9 IMPLANT
NS IRRIG 1000ML POUR BTL (IV SOLUTION) ×15 IMPLANT
PACK E OPEN HEART (SUTURE) ×3 IMPLANT
PACK OPEN HEART (CUSTOM PROCEDURE TRAY) ×3 IMPLANT
PAD ARMBOARD 7.5X6 YLW CONV (MISCELLANEOUS) ×6 IMPLANT
PAD ELECT DEFIB RADIOL ZOLL (MISCELLANEOUS) ×3 IMPLANT
PENCIL BUTTON HOLSTER BLD 10FT (ELECTRODE) ×3 IMPLANT
PUNCH AORTIC ROTATE 4.0MM (MISCELLANEOUS) IMPLANT
PUNCH AORTIC ROTATE 4.5MM 8IN (MISCELLANEOUS) ×3 IMPLANT
PUNCH AORTIC ROTATE 5MM 8IN (MISCELLANEOUS) IMPLANT
SET CARDIOPLEGIA MPS 5001102 (MISCELLANEOUS) ×3 IMPLANT
SLEEVE SURGEON STRL (DRAPES) ×3 IMPLANT
SOLUTION ANTI FOG 6CC (MISCELLANEOUS) IMPLANT
SPONGE LAP 18X18 X RAY DECT (DISPOSABLE) ×6 IMPLANT
SPONGE LAP 4X18 RFD (DISPOSABLE) ×3 IMPLANT
SUT BONE WAX W31G (SUTURE) ×3 IMPLANT
SUT ETHIBOND X763 2 0 SH 1 (SUTURE) ×9 IMPLANT
SUT MNCRL AB 3-0 PS2 18 (SUTURE) ×9 IMPLANT
SUT MNCRL AB 4-0 PS2 18 (SUTURE) IMPLANT
SUT PDS AB 1 CTX 36 (SUTURE) ×6 IMPLANT
SUT PROLENE 2 0 SH DA (SUTURE) IMPLANT
SUT PROLENE 3 0 SH DA (SUTURE) ×3 IMPLANT
SUT PROLENE 3 0 SH1 36 (SUTURE) IMPLANT
SUT PROLENE 4 0 RB 1 (SUTURE)
SUT PROLENE 4 0 SH DA (SUTURE) ×3 IMPLANT
SUT PROLENE 4-0 RB1 .5 CRCL 36 (SUTURE) IMPLANT
SUT PROLENE 5 0 C 1 36 (SUTURE) IMPLANT
SUT PROLENE 6 0 C 1 30 (SUTURE) ×3 IMPLANT
SUT PROLENE 7.0 RB 3 (SUTURE) ×9 IMPLANT
SUT PROLENE 8 0 BV175 6 (SUTURE) ×6 IMPLANT
SUT PROLENE BLUE 7 0 (SUTURE) ×6 IMPLANT
SUT PROLENE POLY MONO (SUTURE) IMPLANT
SUT SILK  1 MH (SUTURE) ×2
SUT SILK 1 MH (SUTURE) ×4 IMPLANT
SUT STEEL 6MS V (SUTURE) ×3 IMPLANT
SUT STEEL STERNAL CCS#1 18IN (SUTURE) IMPLANT
SUT STEEL SZ 6 DBL 3X14 BALL (SUTURE) ×6 IMPLANT
SUT VIC AB 1 CTX 36 (SUTURE)
SUT VIC AB 1 CTX36XBRD ANBCTR (SUTURE) IMPLANT
SUT VIC AB 2-0 CT1 27 (SUTURE) ×1
SUT VIC AB 2-0 CT1 TAPERPNT 27 (SUTURE) ×2 IMPLANT
SUT VIC AB 2-0 CTX 27 (SUTURE) IMPLANT
SUT VIC AB 3-0 SH 27 (SUTURE)
SUT VIC AB 3-0 SH 27X BRD (SUTURE) IMPLANT
SUT VIC AB 3-0 SH 8-18 (SUTURE) ×3 IMPLANT
SUT VIC AB 3-0 X1 27 (SUTURE) ×3 IMPLANT
SUT VICRYL 4-0 PS2 18IN ABS (SUTURE) IMPLANT
SYSTEM SAHARA CHEST DRAIN ATS (WOUND CARE) ×3 IMPLANT
TABLE PACK (MISCELLANEOUS) ×3 IMPLANT
TAPE CLOTH SURG 4X10 WHT LF (GAUZE/BANDAGES/DRESSINGS) ×3 IMPLANT
TAPE PAPER 2X10 WHT MICROPORE (GAUZE/BANDAGES/DRESSINGS) ×3 IMPLANT
TOWEL GREEN STERILE (TOWEL DISPOSABLE) ×3 IMPLANT
TOWEL GREEN STERILE FF (TOWEL DISPOSABLE) ×3 IMPLANT
TRAY FOLEY SLVR 16FR TEMP STAT (SET/KITS/TRAYS/PACK) ×3 IMPLANT
TUBING INSUFFLATION (TUBING) ×3 IMPLANT
UNDERPAD 30X30 (UNDERPADS AND DIAPERS) ×3 IMPLANT
VRC MALLEABLE SINGLE STG 24FR (MISCELLANEOUS) ×3
WATER STERILE IRR 1000ML POUR (IV SOLUTION) ×6 IMPLANT

## 2018-05-10 NOTE — Progress Notes (Signed)
  Echocardiogram Echocardiogram Transesophageal has been performed.  Janalyn Harder 05/10/2018, 8:47 AM

## 2018-05-10 NOTE — Transfer of Care (Signed)
Immediate Anesthesia Transfer of Care Note  Patient: Brian [name]  Procedure(s) Performed: CORONARY ARTERY BYPASS GRAFTING (CABG) x three, using left internal mammary artery and right leg greater saphenous vein harvested endoscopically and placement of left ventricular epicardial pacermaker lead (N/A Chest) TRANSESOPHAGEAL ECHOCARDIOGRAM (TEE) (N/A )  Patient Location: SICU  Anesthesia Type:General  Level of Consciousness: Patient remains intubated per anesthesia plan  Airway & Oxygen Therapy: Patient remains intubated per anesthesia plan and Patient placed on Ventilator (see vital sign flow sheet for setting)  Post-op Assessment: Report given to RN and Post -op Vital signs reviewed and stable  Post vital signs: Reviewed and stable  Last Vitals:  Vitals Value Taken Time  BP    Temp 35.9 C 05/10/2018  1:03 PM  Pulse 95 05/10/2018  1:03 PM  Resp 13 05/10/2018  1:03 PM  SpO2 93 % 05/10/2018  1:03 PM  Vitals shown include unvalidated device data.  Last Pain:  Vitals:   05/10/18 0433  TempSrc: Oral  PainSc:       Patients Stated Pain Goal: 0 (05/08/18 0848)  Complications: No apparent anesthesia complications

## 2018-05-10 NOTE — Progress Notes (Signed)
      [address]       [place] [address]             [phone]     CARDIOTHORACIC SURGERY PROGRESS NOTE  Subjective: Brian [name] has been scheduled for Procedure(s): RIGHT/LEFT HEART CATH AND CORONARY ANGIOGRAPHY (N/A) today.   Objective: Vital signs in last 24 hours: Temp:  [97.7 F (36.5 C)-98.5 F (36.9 C)] 98 F (36.7 C) (02/13 0433) Pulse Rate:  [58-85] 72 (02/13 0433) Cardiac Rhythm: Ventricular paced (02/12 2115) Resp:  [11-38] 18 (02/13 0433) BP: (104-130)/(60-87) 117/72 (02/13 0433) SpO2:  [93 %-99 %] 93 % (02/13 0433)  Physical Exam: Unchanged from previously   Intake/Output from previous day: 02/12 0701 - 02/13 0700 In: 546 [P.O.:540; I.V.:6] Out: 850 [Urine:850] Intake/Output this shift: Total I/O In: 480 [P.O.:480] Out: 850 [Urine:850]  Lab Results: Recent Labs    05/07/18 2346  05/09/18 1022 05/09/18 1239  WBC 5.9  --   --  6.0  HGB 13.2   < > 12.9* 14.1  HCT 40.6   < > 38.0* 43.1  PLT 159  --   --  156   < > = values in this interval not displayed.   BMET:  Recent Labs    05/07/18 2346 05/09/18 0417 05/09/18 1018 05/09/18 1022 05/09/18 1239  NA 140 141 141 141  --   K 3.9 3.9 3.9 4.0  --   CL 106 106  --   --   --   CO2 23 26  --   --   --   GLUCOSE 107* 93  --   --   --   BUN 20 20  --   --   --   CREATININE 1.54* 1.27*  --   --  1.33*  CALCIUM 8.9 8.8*  --   --   --     CBG (last 3)  Recent Labs    05/10/18 0515  GLUCAP 112*   PT/INR:   Recent Labs    05/08/18 0101  LABPROT 13.9  INR 1.08    Assessment/Plan:   The various methods of treatment have been discussed with the patient. After consideration of the risks, benefits and treatment options the patient has consented to the planned procedure.   The patient has been seen and labs reviewed. There are no changes in the patient's condition to prevent proceeding with the planned procedure today.   Purcell Nails, MD 05/10/2018 5:47 AM

## 2018-05-10 NOTE — Brief Op Note (Signed)
05/07/2018 - 05/10/2018  11:07 AM  PATIENT:  Neoma Lamingommy H Berch  73 y.o. male  PRE-OPERATIVE DIAGNOSIS:  CAD  POST-OPERATIVE DIAGNOSIS:  CAD  PROCEDURE:  Procedure(s): CORONARY ARTERY BYPASS GRAFTING (CABG) x three, using left internal mammary artery and right leg greater saphenous vein harvested endoscopically and placement of left ventricular epicardial pacermaker lead (N/A) TRANSESOPHAGEAL ECHOCARDIOGRAM (TEE) (N/A) LIMA-LAD SEQ [id number]  SURGEON:  Surgeon(s) and Role:    Purcell Nails* Deontaye Civello H, MD - Primary  PHYSICIAN ASSISTANT: WAYNE GOLD PA-C  ANESTHESIA:   general  EBL:  500   BLOOD ADMINISTERED:none  DRAINS: ROUTINE PLEURAL AND PERICARDIAL CHEST TUBES   LOCAL MEDICATIONS USED:  NONE  SPECIMEN:  No Specimen  DISPOSITION OF SPECIMEN:  N/A  COUNTS:  YES  TOURNIQUET:  * No tourniquets in log *  DICTATION: .Dragon Dictation  PLAN OF CARE: Admit to inpatient   PATIENT DISPOSITION:  ICU - intubated and hemodynamically stable.   Delay start of Pharmacological VTE agent (>24hrs) due to surgical blood loss or risk of bleeding: yes  COMPLICATIONS: NO KNOWN

## 2018-05-10 NOTE — Anesthesia Procedure Notes (Signed)
Central Venous Catheter Insertion Performed by: Murvin Natal, MD, anesthesiologist Start/End2/13/2020 7:00 AM, 05/10/2018 7:15 AM Patient location: Pre-op. Preanesthetic checklist: patient identified, IV checked, site marked, risks and benefits discussed, surgical consent, monitors and equipment checked, pre-op evaluation, timeout performed and anesthesia consent Position: Trendelenburg Lidocaine 1% used for infiltration and patient sedated Hand hygiene performed , maximum sterile barriers used  and Seldinger technique used Catheter size: 9 Fr Total catheter length 12. PA cath was placed.MAC introducer Swan type:thermodilution PA Cath depth:45 Procedure performed using ultrasound guided technique. Ultrasound Notes:anatomy identified, needle tip was noted to be adjacent to the nerve/plexus identified and no ultrasound evidence of intravascular and/or intraneural injection Attempts: 1 Following insertion, line sutured, dressing applied and Biopatch. Post procedure assessment: blood return through all ports, free fluid flow and no air  Patient tolerated the procedure well with no immediate complications.

## 2018-05-10 NOTE — Op Note (Addendum)
CARDIOTHORACIC SURGERY OPERATIVE NOTE  Date of Procedure: 05/10/2018  Preoperative Diagnosis:   Severe 3-vessel Coronary Artery Disease  Acute on Chronic Systolic Congestive Heart Failure  Postoperative Diagnosis: Same  Procedure:    Coronary Artery Bypass Grafting x 3   Left Internal Mammary Artery to Distal Left Anterior Descending Coronary Artery  Saphenous Vein Graft to First Obtuse Marginal Branch of Left Circumflex Coronary Artery  Sequential Saphenous Vein Graft to Second Obtuse Marginal Branch of Left Circumflex Coronary Artery  Endoscopic Vein Harvest from Right Thigh  Placement Left Ventricular Epicardial Pacemaker Leads x2  Surgeon: Salvatore Decentlarence H. Cornelius Moraswen, MD  Assistant: Rowe ClackWayne E. Gold, PA-C  Anesthesia: Rosezella FloridaWilliam E. Fitzgerald, MD  Operative Findings:  Severe left ventricular systolic dysfunction, EF estimated 25%  Aortic valve sclerosis without stenosis  Mild-moderate (1+/2+) mitral regurgitation  Good quality left internal mammary artery conduit  Good quality saphenous vein conduit  Good quality target vessels for grafting    BRIEF CLINICAL NOTE AND INDICATIONS FOR SURGERY  Patient is a 73 year old male with no previous history of coronary artery disease but risk factors notable for history of complete heart block status post permanent pacemaker placement in June 2019 and hypertension.  The patient describes at least a 5757-month history of progressive symptoms of exertional shortness of breath, orthopnea, and lower extremity edema consistent with acute exacerbation of chronic combined systolic and diastolic congestive heart failure.  He has not had symptoms of exertional chest pain or chest tightness, although he does get short of breath with moderate and low-level exertion.  He was admitted to the hospital with symptoms at rest and mild pulmonary edema on chest x-ray.  BNP level was elevated.  Troponin level was 0.02.  Patient symptoms have improved with diuretic  therapy.  EKG reveals bundle branch block with sinus rhythm and V-pacing.  Diagnostic cardiac catheterization reveals severe three-vessel coronary artery disease with severe LV systolic dysfunction.  There is diffuse high-grade long segment stenosis of the mid left anterior descending coronary artery, high-grade stenosis of left circumflex coronary artery, and left dominant coronary circulation with diffuse disease involving a small nondominant right coronary artery.  Pulmonary artery pressures were mildly elevated at catheterization.  The patient has been seen in consultation and counseled at length regarding the indications, risks and potential benefits of surgery.  All questions have been answered, and the patient provides full informed consent for the operation as described.   DETAILS OF THE OPERATIVE PROCEDURE  Preparation:  The patient is brought to the operating room on the above mentioned date and central monitoring was established by the anesthesia team including placement of Swan-Ganz catheter and radial arterial line. The patient is placed in the supine position on the operating table.  Intravenous antibiotics are administered. General endotracheal anesthesia is induced uneventfully. A Foley catheter is placed.  Baseline transesophageal echocardiogram was performed.  Findings were notable for severe left ventricular systolic dysfunction.  The left ventricle is dilated.  There are multiple wall motion abnormalities.  Ejection fraction estimated 25%.  There is mild to moderate central mitral regurgitation.  The aortic valve is trileaflet.  The left coronary leaflet is mildly calcified and fibrotic.  It is severely restricted in mobility.  The remaining 2 leaflets move normally.  There is no significant aortic stenosis.  Mean transvalvular gradient is estimated 6 mmHg.  There is no aortic insufficiency.  The patient's chest, abdomen, both groins, and both lower extremities are prepared and draped  in a sterile manner. A time out  procedure is performed.   Surgical Approach and Conduit Harvest:  A median sternotomy incision was performed and the left internal mammary artery is dissected from the chest wall and prepared for bypass grafting. The left internal mammary artery is notably good quality conduit.  Inspection of the left chest performed during internal mammary artery harvest is notable for chronic eventration of the left hemidiaphragm.  Simultaneously, the greater saphenous vein is obtained from the patient's right thigh using endoscopic vein harvest technique. The saphenous vein is notably good quality conduit. After removal of the saphenous vein, the small surgical incisions in the lower extremity are closed with absorbable suture. Following systemic heparinization, the left internal mammary artery was transected distally noted to have excellent flow.   Extracorporeal Cardiopulmonary Bypass and Myocardial Protection:  The pericardium is opened. The ascending aorta is normal in appearance but displaced to the patient's right side.  The entire heart is pushed cephalad and rotated towards the right because of the chronic eventration of the right hemidiaphragm.  Exposure of the right atrium is extremely limited.   The right common femoral vein is cannulated using ultrasound guidance with Seldinger technique and a flexible guidewire advanced under TEE guidance through the right atrium into the superior vena cava.  The right common femoral vein is cannulated with a 22 French long femoral venous cannula with final tip of the cannula position verified using TEE.  The ascending aorta is cannulated for cardiopulmonary bypass.  Adequate heparinization is verified.     The entire pre-bypass portion of the operation was notable for stable hemodynamics.  Cardiopulmonary bypass was begun and the surface of the heart is inspected.  A second venous cannula is placed into the superior vena cava.  Distal  target vessels are selected for coronary artery bypass grafting. A cardioplegia cannula is placed in the ascending aorta.  A temperature probe was placed in the interventricular septum.  The patient is allowed to cool passively to [facility] systemic temperature.  The aortic cross clamp is applied and cold blood cardioplegia is delivered initially in an antegrade fashion through the aortic root.  Iced saline slush is applied for topical hypothermia.  The initial cardioplegic arrest is rapid with early diastolic arrest.  Repeat doses of cardioplegia are administered intermittently throughout the entire cross clamp portion of the operation through the aortic root and through subsequently placed vein grafts in order to maintain completely flat electrocardiogram and septal myocardial temperature below 15C.  Myocardial protection was felt to be excellent.   Coronary Artery Bypass Grafting:   The first obtuse marginal branch of the left circumflex coronary artery was grafted using a reversed saphenous vein graft in an side-to-side fashion.  At the site of distal anastomosis the target vessel was good quality and measured approximately 2.0 mm in diameter.  The second obtuse marginal branch of the left circumflex coronary artery was grafted in and end-to-side fashion using a sequential saphenous vein graft off of the distal segment of the vein graft placed to the first obtuse marginal branch coronary artery.  At the site of distal anastomosis the target vessel was good quality and measured approximately 1.8 mm in diameter.  The distal left anterior coronary artery was grafted with the left internal mammary artery in an end-to-side fashion.  At the site of distal anastomosis the target vessel was fair to good quality and measured approximately 2.0 mm in diameter.  All proximal vein graft anastomoses were placed directly to the ascending aorta prior to removal of  the aortic cross clamp.  The septal myocardial  temperature rose rapidly after reperfusion of the left internal mammary artery graft.  The aortic cross clamp was removed after a total cross clamp time of 55 minutes.   Placement of Left Ventricular Epicardial Pacemaker Leads:  A pair of [employer] screw-in unipolar epicardial pacemaker leads (model #5071-53 cm, serial #[id number] and #[id number]) were implanted into the epicardial surface of the lateral wall of the left ventricle.  One of the leads was implanted proximal to the first obtuse marginal branch and the second implanted between the first and second obtuse marginal branch.  Each lead was subsequently tunneled through the lateral wall of the pericardium into the left pleural space.   Procedure Completion:  All proximal and distal coronary anastomoses were inspected for hemostasis and appropriate graft orientation. Epicardial pacing wires are fixed to the right ventricular outflow tract and to the right atrial appendage. The patient is rewarmed to 37C temperature. The patient is weaned and disconnected from cardiopulmonary bypass.  The patient's rhythm at separation from bypass was AV paced.  The patient was weaned from cardiopulmonary bypass on low dose milrinone and dopamine infusions. Total cardiopulmonary bypass time for the operation was 79 minutes.  Followup transesophageal echocardiogram performed after separation from bypass revealed no changes from the preoperative exam.  The aortic and superior vena cava cannula were removed uneventfully. Protamine was administered to reverse the anticoagulation.  The femoral venous cannula was removed and manual pressure held on the groin for 30 minutes.  The mediastinum and pleural space were inspected for hemostasis and irrigated with saline solution. The mediastinum and the left pleural space were drained using 3 chest tubes placed through separate stab incisions inferiorly.    The epicardial pacemaker leads were tunneled through the chest  wall and buried in a small subcutaneous pocket made in the left anterior chest wall through a small transverse incision.  Each epicardial lead was subsequently tested for appropriate pacer lead sensing and pacing thresholds.  The soft tissues anterior to the aorta were reapproximated loosely. The sternum is closed with double strength sternal wire. The soft tissues anterior to the sternum were closed in multiple layers and the skin is closed with a running subcuticular skin closure.  The pacemaker lead pocket is closed in multiple layers with absorbable suture.  The post-bypass portion of the operation was notable for stable rhythm and hemodynamics.  No blood products were administered during the operation.   Disposition:  The patient tolerated the procedure well and is transported to the surgical intensive care in stable condition. There are no intraoperative complications. All sponge instrument and needle counts are verified correct at completion of the operation.    Salvatore Decentlarence H. Cornelius Moraswen MD 05/10/2018 12:29 PM

## 2018-05-10 NOTE — OR Nursing (Signed)
12:10 - 20 minute call to SICU charge nurse

## 2018-05-10 NOTE — Progress Notes (Signed)
[facility] BRIEF SICU PROGRESS NOTE  Day of Surgery  S/P Procedure(s) (LRB): CORONARY ARTERY BYPASS GRAFTING (CABG) x three, using left internal mammary artery and right leg greater saphenous vein harvested endoscopically and placement of left ventricular epicardial pacermaker lead (N/A) TRANSESOPHAGEAL ECHOCARDIOGRAM (TEE) (N/A)   Extubated uneventfully NSR-Vpaced w/ frequent ectopy Stable hemodynamics on low dose milrinone, dopamine and levophed O2 sats 94% on 6 L/min Chest tube output low UOP adequate Labs okay  Plan: Continue routine early postop  Purcell Nails, MD 05/10/2018 7:04 PM

## 2018-05-10 NOTE — Anesthesia Procedure Notes (Signed)
Procedure Name: Intubation Date/Time: 05/10/2018 7:58 AM Performed by: Epifanio Lesches, CRNA Pre-anesthesia Checklist: Patient identified, Emergency Drugs available, Suction available and Patient being monitored Patient Re-evaluated:Patient Re-evaluated prior to induction Oxygen Delivery Method: Circle System Utilized Preoxygenation: Pre-oxygenation with 100% oxygen Induction Type: IV induction Ventilation: Mask ventilation without difficulty and Oral airway inserted - appropriate to patient size Laryngoscope Size: Hyacinth Meeker and 2 Grade View: Grade I Tube type: Oral Tube size: 8.0 mm Number of attempts: 1 Airway Equipment and Method: Stylet and Oral airway Placement Confirmation: ETT inserted through vocal cords under direct vision,  positive ETCO2 and breath sounds checked- equal and bilateral Secured at: 22 cm Tube secured with: Tape Dental Injury: Teeth and Oropharynx as per pre-operative assessment

## 2018-05-10 NOTE — OR Nursing (Signed)
11:40 - 45 minute call to SICU charge nurse 

## 2018-05-10 NOTE — Anesthesia Procedure Notes (Signed)
Arterial Line Insertion Start/End2/13/2020 6:50 AM, 05/10/2018 6:52 AM Performed by: Epifanio LeschesMercer, Hollan Philipp L, CRNA, CRNA  Patient location: Pre-op. Preanesthetic checklist: patient identified, IV checked, site marked, risks and benefits discussed, surgical consent, monitors and equipment checked, pre-op evaluation, timeout performed and anesthesia consent Lidocaine 1% used for infiltration and patient sedated Left, radial was placed Catheter size: 20 G Hand hygiene performed  and maximum sterile barriers used  Allen's test indicative of satisfactory collateral circulation Attempts: 1 Procedure performed without using ultrasound guided technique. Ultrasound Notes:anatomy identified Following insertion, dressing applied and Biopatch. Post procedure assessment: normal  Patient tolerated the procedure well with no immediate complications.

## 2018-05-10 NOTE — Progress Notes (Signed)
Patient ID: RIZZO HAWKEN, male   DOB: November 23, 1945, 73 y.o.   MRN: [id number]  EP Attending  Asked by Dr. Presley Raddle to way in on his biv PPM upgrade. He has undergone revascularization today due to worsening LV function and 3 vessel disease. He has had an epicardial leaf placed. I would suggest he be managed with routing postop care and undergo repeat 2D echo in 3 months to assess whether or not he needs any additional device procedure. CAll for questions.  Leonia Reeves.D.

## 2018-05-10 NOTE — Procedures (Signed)
Extubation Procedure Note  Patient Details:   Name: Neoma Lamingommy H Ludlum DOB: 1945/11/21 MRN: [id number]   Airway Documentation:    Vent end date: 05/10/18 Vent end time: 1605   Evaluation  O2 sats: stable throughout Complications: No apparent complications Patient did tolerate procedure well. Bilateral Breath Sounds: Diminished, Clear   Yes   Pt was extubated without incident and placed onto a 6 L [facility]   Merlene Laughteratalie  Dejon Jungman 05/10/2018, 4:38 PM

## 2018-05-10 NOTE — Anesthesia Postprocedure Evaluation (Signed)
Anesthesia Post Note  Patient: Brian [name]  Procedure(s) Performed: CORONARY ARTERY BYPASS GRAFTING (CABG) x three, using left internal mammary artery and right leg greater saphenous vein harvested endoscopically and placement of left ventricular epicardial pacermaker lead (N/A Chest) TRANSESOPHAGEAL ECHOCARDIOGRAM (TEE) (N/A )     Patient location during evaluation: SICU Anesthesia Type: General Level of consciousness: sedated Pain management: pain level controlled Vital Signs Assessment: post-procedure vital signs reviewed and stable Respiratory status: patient remains intubated per anesthesia plan Cardiovascular status: stable Postop Assessment: no apparent nausea or vomiting Anesthetic complications: no    Last Vitals:  Vitals:   05/10/18 1400 05/10/18 1403  BP: (!) 91/58   Pulse: 93 84  Resp: 14 18  Temp: (!) 35.9 C (!) 35.8 C  SpO2: 98% 99%    Last Pain:  Vitals:   05/10/18 0433  TempSrc: Oral  PainSc:                  Ginia Rudell,W. EDMOND

## 2018-05-11 ENCOUNTER — Encounter (HOSPITAL_COMMUNITY): Payer: Self-pay | Admitting: Thoracic Surgery (Cardiothoracic Vascular Surgery)

## 2018-05-11 ENCOUNTER — Inpatient Hospital Stay (HOSPITAL_COMMUNITY): Payer: Medicare Other

## 2018-05-11 LAB — CBC
HCT: 33 % — ABNORMAL LOW (ref 39.0–52.0)
HCT: 32.6 % — ABNORMAL LOW (ref 39.0–52.0)
Hemoglobin: 10.9 g/dL — ABNORMAL LOW (ref 13.0–17.0)
Hemoglobin: 10.5 g/dL — ABNORMAL LOW (ref 13.0–17.0)
MCH: 30.7 pg (ref 26.0–34.0)
MCH: 30.1 pg (ref 26.0–34.0)
MCHC: 33 g/dL (ref 30.0–36.0)
MCHC: 32.2 g/dL (ref 30.0–36.0)
MCV: 93 fL (ref 80.0–100.0)
MCV: 93.4 fL (ref 80.0–100.0)
Platelets: 137 10*3/uL — ABNORMAL LOW (ref 150–400)
Platelets: 116 10*3/uL — ABNORMAL LOW (ref 150–400)
RBC: 3.55 MIL/uL — ABNORMAL LOW (ref 4.22–5.81)
RBC: 3.49 MIL/uL — ABNORMAL LOW (ref 4.22–5.81)
RDW: 13.2 % (ref 11.5–15.5)
RDW: 13.3 % (ref 11.5–15.5)
WBC: 19.2 10*3/uL — ABNORMAL HIGH (ref 4.0–10.5)
WBC: 17.7 10*3/uL — ABNORMAL HIGH (ref 4.0–10.5)
nRBC: 0 % (ref 0.0–0.2)
nRBC: 0 % (ref 0.0–0.2)

## 2018-05-11 LAB — MAGNESIUM
Magnesium: 2.7 mg/dL — ABNORMAL HIGH (ref 1.7–2.4)
Magnesium: 2.6 mg/dL — ABNORMAL HIGH (ref 1.7–2.4)

## 2018-05-11 LAB — BASIC METABOLIC PANEL
Anion gap: 11 (ref 5–15)
Anion gap: 9 (ref 5–15)
BUN: 17 mg/dL (ref 8–23)
BUN: 26 mg/dL — ABNORMAL HIGH (ref 8–23)
CO2: 20 mmol/L — ABNORMAL LOW (ref 22–32)
CO2: 23 mmol/L (ref 22–32)
Calcium: 8.1 mg/dL — ABNORMAL LOW (ref 8.9–10.3)
Calcium: 8.3 mg/dL — ABNORMAL LOW (ref 8.9–10.3)
Chloride: 107 mmol/L (ref 98–111)
Chloride: 106 mmol/L (ref 98–111)
Creatinine, Ser: 1.51 mg/dL — ABNORMAL HIGH (ref 0.61–1.24)
Creatinine, Ser: 2.1 mg/dL — ABNORMAL HIGH (ref 0.61–1.24)
GFR calc Af Amer: 53 mL/min — ABNORMAL LOW (ref >60)
GFR calc Af Amer: 35 mL/min — ABNORMAL LOW (ref >60)
GFR calc non Af Amer: 45 mL/min — ABNORMAL LOW (ref >60)
GFR calc non Af Amer: 31 mL/min — ABNORMAL LOW (ref >60)
Glucose, Bld: 121 mg/dL — ABNORMAL HIGH (ref 70–99)
Glucose, Bld: 132 mg/dL — ABNORMAL HIGH (ref 70–99)
Potassium: 4.7 mmol/L (ref 3.5–5.1)
Potassium: 4.7 mmol/L (ref 3.5–5.1)
Sodium: 138 mmol/L (ref 135–145)
Sodium: 138 mmol/L (ref 135–145)

## 2018-05-11 LAB — BLOOD GAS, ARTERIAL
Acid-base deficit: 4.9 mmol/L — ABNORMAL HIGH (ref 0.0–2.0)
Bicarbonate: 20.7 mmol/L (ref 20.0–28.0)
FIO2: 0.21
O2 Saturation: 94.4 %
Patient temperature: 98.6
pCO2 arterial: 45.2 mmHg (ref 32.0–48.0)
pH, Arterial: 7.283 — ABNORMAL LOW (ref 7.350–7.450)
pO2, Arterial: 78.6 mmHg — ABNORMAL LOW (ref 83.0–108.0)

## 2018-05-11 LAB — GLUCOSE, CAPILLARY
Glucose-Capillary: 103 mg/dL — ABNORMAL HIGH (ref 70–99)
Glucose-Capillary: 105 mg/dL — ABNORMAL HIGH (ref 70–99)
Glucose-Capillary: 107 mg/dL — ABNORMAL HIGH (ref 70–99)
Glucose-Capillary: 109 mg/dL — ABNORMAL HIGH (ref 70–99)
Glucose-Capillary: 118 mg/dL — ABNORMAL HIGH (ref 70–99)
Glucose-Capillary: 122 mg/dL — ABNORMAL HIGH (ref 70–99)
Glucose-Capillary: 124 mg/dL — ABNORMAL HIGH (ref 70–99)
Glucose-Capillary: 126 mg/dL — ABNORMAL HIGH (ref 70–99)
Glucose-Capillary: 145 mg/dL — ABNORMAL HIGH (ref 70–99)
Glucose-Capillary: 146 mg/dL — ABNORMAL HIGH (ref 70–99)

## 2018-05-11 LAB — COOXEMETRY PANEL
Carboxyhemoglobin: 1.7 % — ABNORMAL HIGH (ref 0.5–1.5)
Carboxyhemoglobin: 1.6 % — ABNORMAL HIGH (ref 0.5–1.5)
Methemoglobin: 1.7 % — ABNORMAL HIGH (ref 0.0–1.5)
Methemoglobin: 1.3 % (ref 0.0–1.5)
O2 Saturation: 74.8 %
O2 Saturation: 65.3 %
Total hemoglobin: 9.5 g/dL — ABNORMAL LOW (ref 12.0–16.0)
Total hemoglobin: 10.9 g/dL — ABNORMAL LOW (ref 12.0–16.0)

## 2018-05-11 LAB — HEMOGLOBIN A1C
Hgb A1c MFr Bld: 5.5 % (ref 4.8–5.6)
Mean Plasma Glucose: 111 mg/dL

## 2018-05-11 MED ORDER — FUROSEMIDE 10 MG/ML IJ SOLN
80.0000 mg | Freq: Once | INTRAMUSCULAR | Status: AC
  Administered 2018-05-11: 80 mg via INTRAVENOUS
  Filled 2018-05-11: qty 8

## 2018-05-11 MED ORDER — DOPAMINE-DEXTROSE 3.2-5 MG/ML-% IV SOLN
0.0000 ug/kg/min | INTRAVENOUS | Status: DC
  Administered 2018-05-11 – 2018-05-14 (×2): 2 ug/kg/min via INTRAVENOUS
  Filled 2018-05-11 (×2): qty 250

## 2018-05-11 MED ORDER — FUROSEMIDE 10 MG/ML IJ SOLN
40.0000 mg | Freq: Once | INTRAMUSCULAR | Status: AC
  Administered 2018-05-11: 40 mg via INTRAVENOUS

## 2018-05-11 MED ORDER — ENOXAPARIN SODIUM 30 MG/0.3ML ~~LOC~~ SOLN
30.0000 mg | Freq: Every day | SUBCUTANEOUS | Status: DC
  Administered 2018-05-12 – 2018-05-16 (×5): 30 mg via SUBCUTANEOUS
  Filled 2018-05-11 (×5): qty 0.3

## 2018-05-11 MED ORDER — DEXMEDETOMIDINE HCL IN NACL 200 MCG/50ML IV SOLN
0.3000 ug/kg/h | INTRAVENOUS | Status: DC
  Administered 2018-05-11: 0.4 ug/kg/h via INTRAVENOUS
  Filled 2018-05-11 (×2): qty 50

## 2018-05-11 MED ORDER — INSULIN ASPART 100 UNIT/ML ~~LOC~~ SOLN: 0.0000 [IU] | SUBCUTANEOUS | Status: DC

## 2018-05-11 MED ORDER — FUROSEMIDE 10 MG/ML IJ SOLN
40.0000 mg | Freq: Two times a day (BID) | INTRAMUSCULAR | Status: DC
  Administered 2018-05-11: 40 mg via INTRAVENOUS

## 2018-05-11 MED ORDER — INSULIN ASPART 100 UNIT/ML ~~LOC~~ SOLN
0.0000 [IU] | SUBCUTANEOUS | Status: DC
  Administered 2018-05-11 – 2018-05-12 (×5): 2 [IU] via SUBCUTANEOUS

## 2018-05-11 MED ORDER — FUROSEMIDE 10 MG/ML IJ SOLN
10.0000 mg/h | INTRAVENOUS | Status: DC
  Administered 2018-05-11: 10 mg/h via INTRAVENOUS
  Filled 2018-05-11 (×2): qty 25

## 2018-05-11 MED ORDER — IPRATROPIUM-ALBUTEROL 0.5-2.5 (3) MG/3ML IN SOLN
3.0000 mL | Freq: Four times a day (QID) | RESPIRATORY_TRACT | Status: DC | PRN
  Administered 2018-05-11 – 2018-05-15 (×5): 3 mL via RESPIRATORY_TRACT
  Filled 2018-05-11 (×5): qty 3

## 2018-05-11 MED ORDER — FUROSEMIDE 10 MG/ML IJ SOLN: 10.0000 mg/h | INTRAVENOUS | Status: DC

## 2018-05-11 NOTE — Progress Notes (Signed)
CT surgery p.m. Rounds  Patient with pulmonary congestion increased work of breathing due to fluid retention from postoperative renal insufficiency and history of left hemidiaphragm paresis We will start BiPAP and give IV Lasix 40 mg in addition to Lasix drip.  Blood pressure stable, mixed venous saturation this evening 65%.
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
## 2018-05-12 ENCOUNTER — Inpatient Hospital Stay: Payer: Self-pay

## 2018-05-12 ENCOUNTER — Inpatient Hospital Stay (HOSPITAL_COMMUNITY): Payer: Medicare Other

## 2018-05-12 LAB — RENAL FUNCTION PANEL
Albumin: 3.1 g/dL — ABNORMAL LOW (ref 3.5–5.0)
Anion gap: 12 (ref 5–15)
BUN: 47 mg/dL — ABNORMAL HIGH (ref 8–23)
CO2: 21 mmol/L — ABNORMAL LOW (ref 22–32)
Calcium: 8.1 mg/dL — ABNORMAL LOW (ref 8.9–10.3)
Chloride: 102 mmol/L (ref 98–111)
Creatinine, Ser: 3.2 mg/dL — ABNORMAL HIGH (ref 0.61–1.24)
GFR calc Af Amer: 21 mL/min — ABNORMAL LOW (ref >60)
GFR calc non Af Amer: 18 mL/min — ABNORMAL LOW (ref >60)
Glucose, Bld: 116 mg/dL — ABNORMAL HIGH (ref 70–99)
Phosphorus: 5.6 mg/dL — ABNORMAL HIGH (ref 2.5–4.6)
Potassium: 4.4 mmol/L (ref 3.5–5.1)
Sodium: 135 mmol/L (ref 135–145)

## 2018-05-12 LAB — BASIC METABOLIC PANEL
Anion gap: 11 (ref 5–15)
BUN: 36 mg/dL — ABNORMAL HIGH (ref 8–23)
CO2: 23 mmol/L (ref 22–32)
Calcium: 8.1 mg/dL — ABNORMAL LOW (ref 8.9–10.3)
Chloride: 103 mmol/L (ref 98–111)
Creatinine, Ser: 2.92 mg/dL — ABNORMAL HIGH (ref 0.61–1.24)
GFR calc Af Amer: 24 mL/min — ABNORMAL LOW (ref >60)
GFR calc non Af Amer: 20 mL/min — ABNORMAL LOW (ref >60)
Glucose, Bld: 152 mg/dL — ABNORMAL HIGH (ref 70–99)
Potassium: 4.7 mmol/L (ref 3.5–5.1)
Sodium: 137 mmol/L (ref 135–145)

## 2018-05-12 LAB — CBC
HCT: 28.4 % — ABNORMAL LOW (ref 39.0–52.0)
Hemoglobin: 9 g/dL — ABNORMAL LOW (ref 13.0–17.0)
MCH: 29.7 pg (ref 26.0–34.0)
MCHC: 31.7 g/dL (ref 30.0–36.0)
MCV: 93.7 fL (ref 80.0–100.0)
Platelets: 106 10*3/uL — ABNORMAL LOW (ref 150–400)
RBC: 3.03 MIL/uL — ABNORMAL LOW (ref 4.22–5.81)
RDW: 13.2 % (ref 11.5–15.5)
WBC: 14.4 10*3/uL — ABNORMAL HIGH (ref 4.0–10.5)
nRBC: 0 % (ref 0.0–0.2)

## 2018-05-12 LAB — COOXEMETRY PANEL
Carboxyhemoglobin: 1.3 % (ref 0.5–1.5)
Methemoglobin: 1.7 % — ABNORMAL HIGH (ref 0.0–1.5)
O2 Saturation: 67.4 %
Total hemoglobin: 9.4 g/dL — ABNORMAL LOW (ref 12.0–16.0)

## 2018-05-12 LAB — GLUCOSE, CAPILLARY
Glucose-Capillary: 95 mg/dL (ref 70–99)
Glucose-Capillary: 107 mg/dL — ABNORMAL HIGH (ref 70–99)
Glucose-Capillary: 115 mg/dL — ABNORMAL HIGH (ref 70–99)
Glucose-Capillary: 109 mg/dL — ABNORMAL HIGH (ref 70–99)

## 2018-05-12 MED ORDER — SODIUM CHLORIDE 0.9% FLUSH
10.0000 mL | Freq: Two times a day (BID) | INTRAVENOUS | Status: DC
  Administered 2018-05-13: 30 mL
  Administered 2018-05-13 – 2018-05-16 (×5): 10 mL
  Administered 2018-05-16: 30 mL
  Administered 2018-05-17: 10 mL

## 2018-05-12 MED ORDER — FUROSEMIDE 10 MG/ML IJ SOLN
80.0000 mg | Freq: Every day | INTRAMUSCULAR | Status: DC
  Administered 2018-05-12 – 2018-05-14 (×3): 80 mg via INTRAVENOUS
  Filled 2018-05-12 (×4): qty 8

## 2018-05-12 MED ORDER — CHLORHEXIDINE GLUCONATE CLOTH 2 % EX PADS
6.0000 | MEDICATED_PAD | Freq: Every day | CUTANEOUS | Status: DC
  Administered 2018-05-13 – 2018-05-15 (×3): 6 via TOPICAL

## 2018-05-12 MED ORDER — SODIUM CHLORIDE 0.9% FLUSH: 10.0000 mL | INTRAVENOUS | Status: DC | PRN

## 2018-05-12 MED ORDER — GUAIFENESIN ER 600 MG PO TB12
600.0000 mg | ORAL_TABLET | Freq: Two times a day (BID) | ORAL | Status: DC
  Administered 2018-05-12 – 2018-05-17 (×11): 600 mg via ORAL
  Filled 2018-05-12 (×11): qty 1

## 2018-05-12 MED ORDER — FUROSEMIDE 10 MG/ML IJ SOLN: 40.0000 mg | Freq: Once | INTRAMUSCULAR | Status: DC

## 2018-05-12 MED ORDER — METOCLOPRAMIDE HCL 5 MG/ML IJ SOLN
10.0000 mg | Freq: Four times a day (QID) | INTRAMUSCULAR | Status: DC
  Administered 2018-05-12 – 2018-05-13 (×4): 10 mg via INTRAVENOUS
  Filled 2018-05-12 (×4): qty 2

## 2018-05-12 MED ORDER — SIMETHICONE 80 MG PO CHEW
80.0000 mg | CHEWABLE_TABLET | Freq: Four times a day (QID) | ORAL | Status: DC
  Administered 2018-05-12 – 2018-05-17 (×19): 80 mg via ORAL
  Filled 2018-05-12 (×20): qty 1

## 2018-05-12 NOTE — Progress Notes (Signed)
2 Days Post-Op Procedure(s) (LRB): CORONARY ARTERY BYPASS GRAFTING (CABG) x three, using left internal mammary artery and right leg greater saphenous vein harvested endoscopically and placement of left ventricular epicardial pacermaker lead (N/A) TRANSESOPHAGEAL ECHOCARDIOGRAM (TEE) (N/A) Subjective: Patient required BiPAP overnight and low-dose Precedex for discomfort associated with mask ventilation Chest x-ray shows improved aeration today however stomach is dilated with air Urine output remains 20 cc/h but mixed venous saturation is 65% on low-dose dopamine and milrinone with adequate blood pressure  Plan mobilization out of bed, transition to high flow nasal cannula, remove chest tubes, and monitor urine output. Creatinine increased 2.9.  Hopefully be able to limit BiPAP for nighttime only. .  Patient's condition and plan of care discussed with wife at bedside.  Objective: Vital signs in last 24 hours: Temp:  [98 F (36.7 C)-98.5 F (36.9 C)] 98.5 F (36.9 C) (02/15 0908) Pulse Rate:  [44-126] 78 (02/15 0900) Cardiac Rhythm: Normal sinus rhythm;Ventricular paced (02/15 0800) Resp:  [10-25] 11 (02/15 0900) BP: (69-146)/(49-97) 95/56 (02/15 0900) SpO2:  [88 %-100 %] 100 % (02/15 0900) FiO2 (%):  [70 %-100 %] 70 % (02/15 0200)  Hemodynamic parameters for last 24 hours:    Intake/Output from previous day: 02/14 0701 - 02/15 0700 In: 1511.1 [P.O.:240; I.V.:1161.3; IV Piggyback:99.9] Out: 465 [Urine:245; Chest Tube:220] Intake/Output this shift: Total I/O In: 92.2 [I.V.:92.2] Out: 60 [Urine:20; Chest Tube:40]       Exam    General- alert and comfortable    Neck- no JVD, no cervical adenopathy palpable, no carotid bruit   Lungs- clear without rales, wheezes   Cor- regular rate and rhythm, no murmur , gallop   Abdomen- soft, non-tender mildly distended   Extremities - warm, non-tender, minimal edema   Neuro- oriented, appropriate, no focal weakness   Lab Results: Recent  Labs    05/11/18 1654 05/12/18 0426  WBC 17.7* 14.4*  HGB 10.5* 9.0*  HCT 32.6* 28.4*  PLT 116* 106*   BMET:  Recent Labs    05/11/18 1654 05/12/18 0426  NA 138 137  K 4.7 4.7  CL 106 103  CO2 23 23  GLUCOSE 132* 152*  BUN 26* 36*  CREATININE 2.10* 2.92*  CALCIUM 8.3* 8.1*    PT/INR:  Recent Labs    05/10/18 1249  LABPROT 16.9*  INR 1.39   ABG    Component Value Date/Time   PHART 7.283 (L) 05/11/2018 0524   HCO3 20.7 05/11/2018 0524   TCO2 23 05/10/2018 1711   ACIDBASEDEF 4.9 (H) 05/11/2018 0524   O2SAT 67.4 05/12/2018 0428   CBG (last 3)  Recent Labs    05/11/18 2002 05/11/18 2248 05/12/18 0904  GLUCAP 122* 126* 95    Assessment/Plan: S/P Procedure(s) (LRB): CORONARY ARTERY BYPASS GRAFTING (CABG) x three, using left internal mammary artery and right leg greater saphenous vein harvested endoscopically and placement of left ventricular epicardial pacermaker lead (N/A) TRANSESOPHAGEAL ECHOCARDIOGRAM (TEE) (N/A) Postoperative acute respiratory due to hemiparesis, gastric dilatation, fluid retention from low urine output and decreased chest wall compliance  Postoperative acute renal insufficiency creatinine now 2.9 with baseline 1.2  Plan mobilization, continue low-dose inotropes, stop Lasix drip.   LOS: 2 days    Kathlee Nations Trigt III 05/12/2018

## 2018-05-12 NOTE — Progress Notes (Signed)
CT Surgery PM Note  Better day - on hi flow nasal cannula, ambulated in hall nsr Low urine output- creat up to 3.2 BP satisfactory on dopamine, milrinone- [facility] in place

## 2018-05-12 NOTE — Progress Notes (Addendum)
Peripherally Inserted Central Catheter/Midline Placement  The IV Nurse has discussed with the patient and/or persons authorized to consent for the patient, the purpose of this procedure and the potential benefits and risks involved with this procedure.  The benefits include less needle sticks, lab draws from the catheter, and the patient may be discharged home with the catheter. Risks include, but not limited to, infection, bleeding, blood clot (thrombus formation), and puncture of an artery; nerve damage and irregular heartbeat and possibility to perform a PICC exchange if needed/ordered by physician.  Alternatives to this procedure were also discussed.  Bard Power PICC patient education guide, fact sheet on infection prevention and patient information card has been provided to patient /or left at bedside. Consent signed by wife, as patient felt he was too sedated to sign.    PICC/Midline Placement Documentation  PICC Double Lumen 05/12/18 PICC Right Brachial 42 cm 1 cm (Active)  Indication for Insertion or Continuance of Line Prolonged intravenous therapies 05/12/2018  6:00 PM  Exposed Catheter (cm) 1 cm 05/12/2018  6:00 PM  Site Assessment Clean;Dry;Intact 05/12/2018  6:00 PM  Lumen #1 Status Flushed;Blood return noted;Saline locked 05/12/2018  6:00 PM  Lumen #2 Status Flushed;Blood return noted;Saline locked 05/12/2018  6:00 PM  Dressing Type Transparent;Occlusive 05/12/2018  6:00 PM  Dressing Status Clean;Dry;Intact;Antimicrobial disc in place 05/12/2018  6:00 PM  Line Care Connections checked and tightened 05/12/2018  6:00 PM  Line Adjustment (NICU/IV Team Only) No 05/12/2018  6:00 PM  Dressing Intervention New dressing 05/12/2018  6:00 PM  Dressing Change Due 05/19/18 05/12/2018  6:00 PM       Edwin Cap 05/12/2018, 6:18 PM

## 2018-05-12 NOTE — Progress Notes (Signed)
Patient resting comfortably on 8L [place] HFNC. No respiratory distress noted. BIPAP is not needed at this time. BIPAP is set up in patient's room. RT will monitor as needed.

## 2018-05-13 ENCOUNTER — Inpatient Hospital Stay (HOSPITAL_COMMUNITY): Payer: Medicare Other

## 2018-05-13 LAB — TYPE AND SCREEN
ABO/RH(D): A POS
Antibody Screen: NEGATIVE
Unit division: 0
Unit division: 0

## 2018-05-13 LAB — RENAL FUNCTION PANEL
Albumin: 2.8 g/dL — ABNORMAL LOW (ref 3.5–5.0)
Anion gap: 13 (ref 5–15)
BUN: 70 mg/dL — ABNORMAL HIGH (ref 8–23)
CO2: 18 mmol/L — ABNORMAL LOW (ref 22–32)
Calcium: 8.2 mg/dL — ABNORMAL LOW (ref 8.9–10.3)
Chloride: 104 mmol/L (ref 98–111)
Creatinine, Ser: 3.6 mg/dL — ABNORMAL HIGH (ref 0.61–1.24)
GFR calc Af Amer: 18 mL/min — ABNORMAL LOW (ref >60)
GFR calc non Af Amer: 16 mL/min — ABNORMAL LOW (ref >60)
Glucose, Bld: 140 mg/dL — ABNORMAL HIGH (ref 70–99)
Phosphorus: 5.9 mg/dL — ABNORMAL HIGH (ref 2.5–4.6)
Potassium: 4.3 mmol/L (ref 3.5–5.1)
Sodium: 135 mmol/L (ref 135–145)

## 2018-05-13 LAB — URINALYSIS, ROUTINE W REFLEX MICROSCOPIC
Bilirubin Urine: NEGATIVE
Glucose, UA: NEGATIVE mg/dL
Ketones, ur: NEGATIVE mg/dL
Nitrite: NEGATIVE
Protein, ur: NEGATIVE mg/dL
Specific Gravity, Urine: 1.015 (ref 1.005–1.030)
pH: 5 (ref 5.0–8.0)

## 2018-05-13 LAB — CBC
HCT: 28.8 % — ABNORMAL LOW (ref 39.0–52.0)
Hemoglobin: 9.3 g/dL — ABNORMAL LOW (ref 13.0–17.0)
MCH: 30.5 pg (ref 26.0–34.0)
MCHC: 32.3 g/dL (ref 30.0–36.0)
MCV: 94.4 fL (ref 80.0–100.0)
Platelets: 124 10*3/uL — ABNORMAL LOW (ref 150–400)
RBC: 3.05 MIL/uL — ABNORMAL LOW (ref 4.22–5.81)
RDW: 13.9 % (ref 11.5–15.5)
WBC: 14.8 10*3/uL — ABNORMAL HIGH (ref 4.0–10.5)
nRBC: 0 % (ref 0.0–0.2)

## 2018-05-13 LAB — GLUCOSE, CAPILLARY
Glucose-Capillary: 111 mg/dL — ABNORMAL HIGH (ref 70–99)
Glucose-Capillary: 108 mg/dL — ABNORMAL HIGH (ref 70–99)
Glucose-Capillary: 130 mg/dL — ABNORMAL HIGH (ref 70–99)
Glucose-Capillary: 116 mg/dL — ABNORMAL HIGH (ref 70–99)
Glucose-Capillary: 119 mg/dL — ABNORMAL HIGH (ref 70–99)

## 2018-05-13 LAB — BASIC METABOLIC PANEL
Anion gap: 12 (ref 5–15)
BUN: 57 mg/dL — ABNORMAL HIGH (ref 8–23)
CO2: 19 mmol/L — ABNORMAL LOW (ref 22–32)
Calcium: 8.3 mg/dL — ABNORMAL LOW (ref 8.9–10.3)
Chloride: 105 mmol/L (ref 98–111)
Creatinine, Ser: 3.65 mg/dL — ABNORMAL HIGH (ref 0.61–1.24)
GFR calc Af Amer: 18 mL/min — ABNORMAL LOW (ref >60)
GFR calc non Af Amer: 16 mL/min — ABNORMAL LOW (ref >60)
Glucose, Bld: 116 mg/dL — ABNORMAL HIGH (ref 70–99)
Potassium: 4.5 mmol/L (ref 3.5–5.1)
Sodium: 136 mmol/L (ref 135–145)

## 2018-05-13 LAB — COOXEMETRY PANEL
Carboxyhemoglobin: 1.5 % (ref 0.5–1.5)
Methemoglobin: 1.9 % — ABNORMAL HIGH (ref 0.0–1.5)
O2 Saturation: 68.8 %
Total hemoglobin: 9.8 g/dL — ABNORMAL LOW (ref 12.0–16.0)

## 2018-05-13 LAB — BPAM RBC
Blood Product Expiration Date: 202003092359
Blood Product Expiration Date: 202003092359
ISSUE DATE / TIME: 202002130810
ISSUE DATE / TIME: 202002130810
Unit Type and Rh: 6200
Unit Type and Rh: 6200

## 2018-05-13 MED ORDER — INSULIN ASPART 100 UNIT/ML ~~LOC~~ SOLN: 0.0000 [IU] | Freq: Every day | SUBCUTANEOUS | Status: DC

## 2018-05-13 MED ORDER — INSULIN ASPART 100 UNIT/ML ~~LOC~~ SOLN
0.0000 [IU] | Freq: Three times a day (TID) | SUBCUTANEOUS | Status: DC
  Administered 2018-05-14: 2 [IU] via SUBCUTANEOUS

## 2018-05-13 MED ORDER — METOCLOPRAMIDE HCL 5 MG/ML IJ SOLN
5.0000 mg | Freq: Four times a day (QID) | INTRAMUSCULAR | Status: AC
  Administered 2018-05-13 – 2018-05-17 (×16): 5 mg via INTRAVENOUS
  Filled 2018-05-13 (×16): qty 2

## 2018-05-13 NOTE — Progress Notes (Signed)
3 Days Post-Op Procedure(s) (LRB): CORONARY ARTERY BYPASS GRAFTING (CABG) x three, using left internal mammary artery and right leg greater saphenous vein harvested endoscopically and placement of left ventricular epicardial pacermaker lead (N/A) TRANSESOPHAGEAL ECHOCARDIOGRAM (TEE) (N/A) Subjective: Showing slow improvement with decreasing oxygen requirement, no BiPAP requirement last night, and incremental increase in urine output.  Creatinine still slowly rising now 3.6.  Up in chair, hungry and wants heart healthy diet.  Ambulating in hallway.  Co-ox remains greater than 60% on low-dose milrinone and dopamine.  Paced rhythm.  Chest x-ray improved with less gastric dilatation.  Objective: Vital signs in last 24 hours: Temp:  [98.4 F (36.9 C)-98.7 F (37.1 C)] 98.7 F (37.1 C) (02/16 0741) Pulse Rate:  [58-116] 60 (02/16 0825) Cardiac Rhythm: Ventricular paced (02/16 0800) Resp:  [12-29] 13 (02/16 0825) BP: (100-127)/(41-94) 106/67 (02/16 0800) SpO2:  [94 %-100 %] 96 % (02/16 0825) Weight:  [106 kg] 106 kg (02/16 0630)  Hemodynamic parameters for last 24 hours:    Intake/Output from previous day: 02/15 0701 - 02/16 0700 In: 453.1 [I.V.:453.1] Out: 565 [Urine:475; Chest Tube:90] Intake/Output this shift: Total I/O In: 19.8 [I.V.:19.8] Out: -   Diminished breath sounds at left base otherwise clear Abdomen soft Mild peripheral edema Neuro intact  Lab Results: Recent Labs    05/12/18 0426 05/13/18 0412  WBC 14.4* 14.8*  HGB 9.0* 9.3*  HCT 28.4* 28.8*  PLT 106* 124*   BMET:  Recent Labs    05/12/18 1547 05/13/18 0412  NA 135 136  K 4.4 4.5  CL 102 105  CO2 21* 19*  GLUCOSE 116* 116*  BUN 47* 57*  CREATININE 3.20* 3.65*  CALCIUM 8.1* 8.3*    PT/INR:  Recent Labs    05/10/18 1249  LABPROT 16.9*  INR 1.39   ABG    Component Value Date/Time   PHART 7.283 (L) 05/11/2018 0524   HCO3 20.7 05/11/2018 0524   TCO2 23 05/10/2018 1711   ACIDBASEDEF 4.9 (H)  05/11/2018 0524   O2SAT 68.8 05/13/2018 0425   CBG (last 3)  Recent Labs    05/12/18 2024 05/13/18 0308 05/13/18 0733  GLUCAP 109* 111* 108*    Assessment/Plan: S/P Procedure(s) (LRB): CORONARY ARTERY BYPASS GRAFTING (CABG) x three, using left internal mammary artery and right leg greater saphenous vein harvested endoscopically and placement of left ventricular epicardial pacermaker lead (N/A) TRANSESOPHAGEAL ECHOCARDIOGRAM (TEE) (N/A)  Acute postoperative respiratory insufficiency, multifactorial, now improved and off BiPAP  Acute postoperative oliguric renal insufficiency, still making urine with creatinine 3.6  Expected postoperative blood loss anemia  Plan : Continue low-dose dopamine and milrinone until creatinine shows significant improvement  Advance diet, increase ambulation Wean oxygen as tolerated Leave Foley catheter in until tomorrow to monitor urine output   LOS: 3 days    Kathlee Nations Trigt III 05/13/2018

## 2018-05-13 NOTE — Progress Notes (Signed)
CT surgery p.m. Rounds  Patient's urine output slowly increasing PM creatinine plateaued at 3.6 Walked in hallway twice Advancing diet Paced rhythm with adequate blood pressure

## 2018-05-14 ENCOUNTER — Encounter (HOSPITAL_COMMUNITY): Payer: Self-pay | Admitting: Thoracic Surgery (Cardiothoracic Vascular Surgery)

## 2018-05-14 ENCOUNTER — Inpatient Hospital Stay (HOSPITAL_COMMUNITY): Payer: Medicare Other

## 2018-05-14 LAB — COOXEMETRY PANEL
Carboxyhemoglobin: 1.5 % (ref 0.5–1.5)
Methemoglobin: 1.8 % — ABNORMAL HIGH (ref 0.0–1.5)
O2 Saturation: 74.4 %
Total hemoglobin: 9.9 g/dL — ABNORMAL LOW (ref 12.0–16.0)

## 2018-05-14 LAB — COMPREHENSIVE METABOLIC PANEL
ALT: 23 U/L (ref 0–44)
AST: 27 U/L (ref 15–41)
Albumin: 2.9 g/dL — ABNORMAL LOW (ref 3.5–5.0)
Alkaline Phosphatase: 41 U/L (ref 38–126)
Anion gap: 10 (ref 5–15)
BUN: 67 mg/dL — ABNORMAL HIGH (ref 8–23)
CO2: 20 mmol/L — ABNORMAL LOW (ref 22–32)
Calcium: 8.4 mg/dL — ABNORMAL LOW (ref 8.9–10.3)
Chloride: 107 mmol/L (ref 98–111)
Creatinine, Ser: 2.91 mg/dL — ABNORMAL HIGH (ref 0.61–1.24)
GFR calc Af Amer: 24 mL/min — ABNORMAL LOW (ref >60)
GFR calc non Af Amer: 21 mL/min — ABNORMAL LOW (ref >60)
Glucose, Bld: 112 mg/dL — ABNORMAL HIGH (ref 70–99)
Potassium: 3.7 mmol/L (ref 3.5–5.1)
Sodium: 137 mmol/L (ref 135–145)
Total Bilirubin: 1.1 mg/dL (ref 0.3–1.2)
Total Protein: 6.1 g/dL — ABNORMAL LOW (ref 6.5–8.1)

## 2018-05-14 LAB — CBC
HCT: 28.6 % — ABNORMAL LOW (ref 39.0–52.0)
Hemoglobin: 9 g/dL — ABNORMAL LOW (ref 13.0–17.0)
MCH: 29.6 pg (ref 26.0–34.0)
MCHC: 31.5 g/dL (ref 30.0–36.0)
MCV: 94.1 fL (ref 80.0–100.0)
Platelets: 146 10*3/uL — ABNORMAL LOW (ref 150–400)
RBC: 3.04 MIL/uL — ABNORMAL LOW (ref 4.22–5.81)
RDW: 14.1 % (ref 11.5–15.5)
WBC: 10 10*3/uL (ref 4.0–10.5)
nRBC: 0 % (ref 0.0–0.2)

## 2018-05-14 LAB — GLUCOSE, CAPILLARY
Glucose-Capillary: 100 mg/dL — ABNORMAL HIGH (ref 70–99)
Glucose-Capillary: 137 mg/dL — ABNORMAL HIGH (ref 70–99)
Glucose-Capillary: 99 mg/dL (ref 70–99)
Glucose-Capillary: 116 mg/dL — ABNORMAL HIGH (ref 70–99)
Glucose-Capillary: 124 mg/dL — ABNORMAL HIGH (ref 70–99)
Glucose-Capillary: 117 mg/dL — ABNORMAL HIGH (ref 70–99)

## 2018-05-14 MED ORDER — ATORVASTATIN CALCIUM 80 MG PO TABS
80.0000 mg | ORAL_TABLET | Freq: Every day | ORAL | Status: DC
  Administered 2018-05-15 – 2018-05-16 (×2): 80 mg via ORAL
  Filled 2018-05-14 (×2): qty 1

## 2018-05-14 MED ORDER — MOVING RIGHT ALONG BOOK
Freq: Once | Status: AC
  Administered 2018-05-14: 10:00:00
  Filled 2018-05-14: qty 1

## 2018-05-14 NOTE — Research (Signed)
VISIT 2  Blood collection 05/10/2018 @ 0715 Urine collection 05/10/2018 @ 0810  [id number]  MOBILITY:    I HAVE NO PROBLEMS WALKING [x]   I HAVE SLIGHT PROBLEMS WALKING []   I HAVE MODERATE PROBLEMS WALKING []   I HAVE SEVERE PROBLEMS WALKING []   I AM UNABLE TO WALK  []     SELF-CARE:   I HAVE NO PROBLEMS WASING OR DRESSING MYSELF  [x]   I HAVE SLIGHT PROBLEMS WASHING OR DRESSING MYSELF  []   I HAVE MODERATE PROBLEMS WASHING OR DRESSING MYSELF []   I HAVE SEVERE PROBLEMS WASHING OR DRESSING MYSELF  []   I HAVE SEVERE PROBLEMS WASHING OR DRESSING MYSELF  []   I AM UNABLE TO WASH OR DRESS MYSELF []     USUAL ACTIVITIES: (E.G. WORK/STUDY/HOUSEWORK/FAMILY OR LEISURE ACTIVITIES.    I HAVE NO PROBLEMS DOING MY USUAL ACTIVITIES [x]   I HAVE SLIGHT PROBLEMS DOING MY USUAL ACTIVITIES []   I HAVE MODERATE PROBLEMS DOING MY USUAL ACTIVIITIES []   I HAVE SEVERE PROBLEMS DOING MY USUAL ACTIVITIES []   I AM UNABLE TO DO MY USUAL ACTIVITIES []     PAIN /DISCOMFORT   I HAVE NO PAIN OR DISCOMFORT [x]   I HAVE SLIGHT PAIN OR DISCOMFORT []   I HAVE MODERATE PAIN OR DISCOMFORT []   I HAVE SEVERE PAIN OR DISCOMFORT []   I HAVE EXTREME PAIN OR DISCOMFORT []     ANXIETY/DEPRESSION   I AM NOT ANXIOUS OR DEPRESSED [x]   I AM SLIGHTLY ANXIOUS OR DEPRESSED []   I AM MODERATELY ANXIOUS OR DREPRESSED []   I AM SEVERELY ANXIOUS OR DEPRESSED []   I AM EXTREMELY ANXIOUS OR DEPRESSED []     SCALE OF 0-100 HOW WOULD YOU RATE TODAY?  0 IS THE WORSE AND 100 IS THE BEST HEALTH YOU CAN IMAGINE: 50   Post Surgery  Nephro check 05/10/2018 @ 1508  0.15 Nephro check 05/10/2018 @ 1715  0.26

## 2018-05-14 NOTE — Progress Notes (Signed)
Patient ID: Brian [name], male   DOB: [birth year], 73 y.o.   MRN: [id number] TCTS Evening Rounds:  Hemodynamically stable. Dopamine off now. Still on milrinone 0.2 sats 99%  Excellent urine output today.  Has been up in chair all day and going to walk.  Wean milrinone as tolerated.
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
## 2018-05-14 NOTE — Plan of Care (Signed)
Pt in chair. Wife at bedside. Vital signs currently stable. No complaints at this time. On 2L [place]. Dopamine weaned off thi shift. Milrinone still infusing. No new changes at this time. Ambulated this shift. Will continue to monitor.  Problem: Education: Goal: Ability to verbalize understanding of medication therapies will improve Outcome: Progressing   Problem: Activity: Goal: Capacity to carry out activities will improve Outcome: Progressing   Problem: Cardiovascular: Goal: Vascular access site(s) Level 0-1 will be maintained Outcome: Progressing   Problem: Activity: Goal: Ability to return to baseline activity level will improve Outcome: Progressing   Problem: Respiratory: Goal: Respiratory status will improve Outcome: Progressing

## 2018-05-14 NOTE — Discharge Summary (Addendum)
Physician Discharge Summary  Patient ID: Brian [name] MRN: [id number] DOB/AGE: 07/13/1945 73 y.o.  Admit date: 05/07/2018 Discharge date: 05/17/2018  Admission Diagnoses: 1. Severe coronary artery disease 2. Acute on chronic congestive heart failure  Discharge Diagnoses:  1. S/P CABG x 3 2. ABL anemia 3. History of chronic kidney disease (CKD), stage II (mild) 4. History of complete heart block (HCC);S/P placement of cardiac pacemaker 09/08/17 MDT 5. History of essential hypertension, benign 6. History of eustachian tube disorder  Discharged Condition: good  History of Present Illness:  [name] is a 73 year old male with no previous history of coronary artery disease but risk factors notable for history of complete heart block status post permanent pacemaker placement in June 2019 and hypertension.  The patient describes at least a 2745-month history of progressive symptoms of exertional shortness of breath, orthopnea, and lower extremity edema consistent with acute exacerbation of chronic combined systolic and diastolic congestive heart failure.  He has not had symptoms of exertional chest pain or chest tightness, although he does get short of breath with moderate and low-level exertion.  He was admitted to the hospital with symptoms at rest and mild pulmonary edema on chest x-ray.  BNP level was elevated.  Troponin level was 0.02.  Patient symptoms have improved with diuretic therapy.   It was felt cardiac catheterization and Echocardiogram would be indicated.  Catheterization showed multivessel CAD.  No valvular abnormalities were identified on Echocardiogram.  It was felt coronary bypass grafting would be indicated and TCTS consult was obtained.  Hospital Course:   [name] remained chest pain free during admission.  He was evaluated by Dr. Cornelius Moraswen who felt coronary bypass grafting would be indicated.  The risks and benefits of the procedure were explained to the patient and he was  agreeable to proceed.  He was taken to the operating room on 05/10/2018.  He underwent CABG x 3 utilizing LIMA to LAD, and Sequential SVG to OM 1 and OM 2.  He underwent placement of Left Ventricular pacemaker leads x 2.  He also underwent endoscopic harvest greater saphenous vein from right leg.  He tolerated the procedure without difficulty and was taken to the SICU in stable condition.  He was extubated the evening of surgery.  During his stay in the SICU the patient was weaned off Milrinone, Dopamine, and Levophed.  His chest tubes and arterial lines were removed without difficulty.  He was treated aggressively with IV diuretics for hypervolemia.  However he developed an elevation in his creatinine level to 2.9 and his diuretics were stopped.  Of note, he has CKD (stage II, basline around 1.54). His creatinine level peaked at 3.6.  This improved slowly .  He was felt surgically stable for transfer from the ICU to [facility] for further convalescence on 02/19. His creatinine continued to decrease and as of today is 1.54. He is on Lasix 40 mg two times daily, as taken prior to surgery.  His wounds are all clean, dry and well healed. Epicardial pacing wires have previously been removed. Chest tube sutures will removed in the office after discharge. He has been weaned to one liter of oxygen via [place]. He has ambulated two times this am already on room air. The nurse reports he has not had desaturation with ambulation on room air. As discussed with Dr. Cornelius Moraswen, he is surgically stable for discharge today.   Significant Diagnostic Studies: angiography:    Severe calcific three-vessel coronary disease involving the nondominant right  coronary, the mid circumflex and 2 large obtuse marginal branches, and the mid segment of the LAD and proximal portion of the first diagonal.  Global left ventricular hypokinesis.  Will need to differentiate if this is ischemically mediated versus pacemaker related decline.  The patient has  shortness of breath which could be an ischemic equivalent.  Normal pulmonary artery pressures.  Treatments: surgery:    Coronary Artery Bypass Grafting x 3              Left Internal Mammary Artery to Distal Left Anterior Descending Coronary Artery             Saphenous Vein Graft to First Obtuse Marginal Branch of Left Circumflex Coronary Artery             Sequential Saphenous Vein Graft to Second Obtuse Marginal Branch of Left Circumflex Coronary Artery             Endoscopic Vein Harvest from Right Thigh             Placement Left Ventricular Epicardial Pacemaker Leads x2  Discharge Exam: Blood pressure 130/70, pulse 85, temperature 97.6 F (36.4 C), temperature source Oral, resp. rate 19, height 5\' 10"  (1.778 m), weight 96.8 kg, SpO2 95 %. Cardiovascular: V paced Pulmonary: Clear to auscultation bilaterally Abdomen: Soft, non tender, bowel sounds present. Extremities: Trace bilateral lower extremity edema. Wounds: Clean and dry.  No erythema or signs of infection.  Disposition: Discharge disposition: 01-Home or Self Care      Stable and discharged to home.  Discharge Medications:  Allergies as of 05/17/2018      Reactions   Percodan [oxycodone-aspirin]    Confusion    Tomato Cough   Allergic to Ketchup   Lisinopril Rash      Medication List    STOP taking these medications   ibuprofen 200 MG tablet Commonly known as:  ADVIL,MOTRIN   losartan 50 MG tablet Commonly known as:  COZAAR   Magnesium 250 MG Tabs     TAKE these medications   aspirin EC 81 MG tablet Take 81 mg by mouth daily.   atorvastatin 80 MG tablet Commonly known as:  LIPITOR Take 1 tablet (80 mg total) by mouth daily at 6 PM.   carvedilol 6.25 MG tablet Commonly known as:  COREG Take 1 tablet (6.25 mg total) by mouth 2 (two) times daily. What changed:    medication strength  how much to take   cycloSPORINE 0.05 % ophthalmic emulsion Commonly known as:  RESTASIS Place 1 drop into  both eyes 2 (two) times daily.   EQ VISION FORMULA 50+ Caps Take 1 capsule by mouth daily.   furosemide 40 MG tablet Commonly known as:  LASIX Take 1 tablet (40 mg total) by mouth daily. What changed:  when to take this   Potassium 99 MG Tabs Take 1 tablet (99 mg total) by mouth daily.   traMADol 50 MG tablet Commonly known as:  ULTRAM Take 1 tablet (50 mg total) by mouth every 8 (eight) hours as needed for moderate pain.   vitamin C 1000 MG tablet Take 1,000 mg by mouth daily.     The patient has been discharged on:   1.Beta Blocker:  Yes [x   ]                              No   [   ]  If No, reason:  2.Ace Inhibitor/ARB: Yes [   ]                                     No  [  x  ]                                     If No, reason:Elevated creatinine  3.Statin:   Yes [ X  ]                  No  [   ]                  If No, reason:  4.Ecasa:  Yes  [ X  ]                  No   [   ]                  If No, reason:  Follow-up Information    Leone Brand, NP. Go on 05/28/2018.   Specialties:  Cardiology, Radiology Why:  Appointment time is at 11:00 am Contact information: [address] [place] [address] [phone]        Purcell Nails, MD. Go on 06/04/2018.   Specialty:  Cardiothoracic Surgery Why:  PA/LAT CXR to be taken (at [facility] which is in the same building as Dr. Orvan July office) on 03/09 at 12:30 pm;Appointment time is at 1:00 pm Contact information: [address] [employer] [address] [place] [address] [phone]        Nurse. Go on 05/25/2018.   Why:  Appointment is with nurse only for chest tubes suture removal. Appointment time is at 10:30 am Contact information: [address] [employer] [address] [place] [address]          Signed: Elenore Rota 05/17/2018, 9:33 AM

## 2018-05-15 ENCOUNTER — Inpatient Hospital Stay (HOSPITAL_COMMUNITY): Payer: Medicare Other

## 2018-05-15 LAB — COOXEMETRY PANEL
Carboxyhemoglobin: 1.8 % — ABNORMAL HIGH (ref 0.5–1.5)
Methemoglobin: 1.1 % (ref 0.0–1.5)
O2 Saturation: 64.7 %
Total hemoglobin: 9.8 g/dL — ABNORMAL LOW (ref 12.0–16.0)

## 2018-05-15 LAB — CBC
HCT: 30.5 % — ABNORMAL LOW (ref 39.0–52.0)
Hemoglobin: 9.4 g/dL — ABNORMAL LOW (ref 13.0–17.0)
MCH: 29.2 pg (ref 26.0–34.0)
MCHC: 30.8 g/dL (ref 30.0–36.0)
MCV: 94.7 fL (ref 80.0–100.0)
Platelets: 180 10*3/uL (ref 150–400)
RBC: 3.22 MIL/uL — ABNORMAL LOW (ref 4.22–5.81)
RDW: 14.3 % (ref 11.5–15.5)
WBC: 10.7 10*3/uL — ABNORMAL HIGH (ref 4.0–10.5)
nRBC: 0 % (ref 0.0–0.2)

## 2018-05-15 LAB — BASIC METABOLIC PANEL
Anion gap: 9 (ref 5–15)
BUN: 59 mg/dL — ABNORMAL HIGH (ref 8–23)
CO2: 24 mmol/L (ref 22–32)
Calcium: 8.4 mg/dL — ABNORMAL LOW (ref 8.9–10.3)
Chloride: 108 mmol/L (ref 98–111)
Creatinine, Ser: 2.11 mg/dL — ABNORMAL HIGH (ref 0.61–1.24)
GFR calc Af Amer: 35 mL/min — ABNORMAL LOW (ref >60)
GFR calc non Af Amer: 30 mL/min — ABNORMAL LOW (ref >60)
Glucose, Bld: 106 mg/dL — ABNORMAL HIGH (ref 70–99)
Potassium: 3.5 mmol/L (ref 3.5–5.1)
Sodium: 141 mmol/L (ref 135–145)

## 2018-05-15 LAB — GLUCOSE, CAPILLARY: Glucose-Capillary: 110 mg/dL — ABNORMAL HIGH (ref 70–99)

## 2018-05-15 LAB — POTASSIUM: Potassium: 3.9 mmol/L (ref 3.5–5.1)

## 2018-05-15 MED ORDER — MOVING RIGHT ALONG BOOK
Freq: Once | Status: AC
  Administered 2018-05-15: 15:00:00
  Filled 2018-05-15: qty 1

## 2018-05-15 MED ORDER — ENSURE ENLIVE PO LIQD
237.0000 mL | Freq: Two times a day (BID) | ORAL | Status: DC
  Administered 2018-05-16: 237 mL via ORAL

## 2018-05-15 MED ORDER — POTASSIUM CHLORIDE CRYS ER 20 MEQ PO TBCR: 40.0000 meq | EXTENDED_RELEASE_TABLET | Freq: Once | ORAL | Status: DC

## 2018-05-15 MED ORDER — SODIUM CHLORIDE 0.9 % IV SOLN: 250.0000 mL | INTRAVENOUS | Status: DC | PRN

## 2018-05-15 MED ORDER — FUROSEMIDE 10 MG/ML IJ SOLN
80.0000 mg | Freq: Two times a day (BID) | INTRAMUSCULAR | Status: AC
  Administered 2018-05-15 (×2): 80 mg via INTRAVENOUS
  Filled 2018-05-15 (×2): qty 8

## 2018-05-15 MED ORDER — SODIUM CHLORIDE 0.9% FLUSH: 3.0000 mL | INTRAVENOUS | Status: DC | PRN

## 2018-05-15 MED ORDER — SODIUM CHLORIDE 0.9% FLUSH
3.0000 mL | Freq: Two times a day (BID) | INTRAVENOUS | Status: DC
  Administered 2018-05-15 – 2018-05-17 (×5): 3 mL via INTRAVENOUS

## 2018-05-15 MED ORDER — FUROSEMIDE 10 MG/ML IJ SOLN: 80.0000 mg | Freq: Two times a day (BID) | INTRAMUSCULAR | Status: DC

## 2018-05-15 MED ORDER — POTASSIUM CHLORIDE CRYS ER 20 MEQ PO TBCR
40.0000 meq | EXTENDED_RELEASE_TABLET | Freq: Two times a day (BID) | ORAL | Status: AC
  Administered 2018-05-15 (×2): 40 meq via ORAL
  Filled 2018-05-15 (×2): qty 2

## 2018-05-15 MED FILL — Heparin Sodium (Porcine) Inj 1000 Unit/ML: INTRAMUSCULAR | Qty: 30 | Status: AC

## 2018-05-15 MED FILL — Electrolyte-R (PH 7.4) Solution: INTRAVENOUS | Qty: 5000 | Status: AC

## 2018-05-15 MED FILL — Mannitol IV Soln 20%: INTRAVENOUS | Qty: 500 | Status: AC

## 2018-05-15 MED FILL — Heparin Sodium (Porcine) Inj 1000 Unit/ML: INTRAMUSCULAR | Qty: 10 | Status: AC

## 2018-05-15 MED FILL — Magnesium Sulfate Inj 50%: INTRAMUSCULAR | Qty: 10 | Status: AC

## 2018-05-15 MED FILL — Potassium Chloride Inj 2 mEq/ML: INTRAVENOUS | Qty: 40 | Status: AC

## 2018-05-15 MED FILL — Calcium Chloride Inj 10%: INTRAVENOUS | Qty: 10 | Status: AC

## 2018-05-15 MED FILL — Sodium Chloride IV Soln 0.9%: INTRAVENOUS | Qty: 2000 | Status: AC

## 2018-05-15 MED FILL — Sodium Bicarbonate IV Soln 8.4%: INTRAVENOUS | Qty: 50 | Status: AC

## 2018-05-15 MED FILL — Lidocaine HCl(Cardiac) IV PF Soln Pref Syr 100 MG/5ML (2%): INTRAVENOUS | Qty: 5 | Status: AC

## 2018-05-15 NOTE — Progress Notes (Signed)
CARDIAC REHAB PHASE I   PRE:  Rate/Rhythm: 78 AV paced  BP:  Supine: 134/64  Sitting:   Standing:    SaO2: 99 2L  MODE:  Ambulation: 350 ft   POST:  Rate/Rhythm: 64 AV paced  BP:  Supine:   Sitting: 125/81  Standing:    SaO2: 100% 2L  1355- 1445  Pt assisted to ambulate 350 ft with one assist using a rolling walker. Pt reports no symptoms. Pt educated on sternal precautions for movement in the bed and getting up to ambulate as well as use of IS. Pt has visitor when returned to room so pt requested to stay sitting on side of bed. VS good with no complaints. Call bell left within reach and pt instructed to call for nurse before laying down in bed. Pt verbalized understanding.    Lyda Kalata RN, BSN 05/15/2018 2:40 PM

## 2018-05-15 NOTE — Progress Notes (Addendum)
[facility] DAILY ICU PROGRESS NOTE                   [address]            [place] [address]          [phone]   5 Days Post-Op Procedure(s) (LRB): CORONARY ARTERY BYPASS GRAFTING (CABG) x three, using left internal mammary artery and right leg greater saphenous vein harvested endoscopically and placement of left ventricular epicardial pacermaker lead (N/A) TRANSESOPHAGEAL ECHOCARDIOGRAM (TEE) (N/A)  Total Length of Stay:  LOS: 5 days   Subjective: Patient states he has had a busy morning. He has taken a long walk, had his x ray. He has some shortness of breath, wheezing this am. He states he is bloated but denies abdominal pain, nausea, or vomiting.  Objective: Vital signs in last 24 hours: Temp:  [97.7 F (36.5 C)-99.1 F (37.3 C)] 99.1 F (37.3 C) (02/18 0700) Pulse Rate:  [41-120] 82 (02/18 0400) Cardiac Rhythm: Ventricular paced (02/18 0015) Resp:  [12-26] 17 (02/18 0400) BP: (102-141)/(40-85) 111/69 (02/18 0400) SpO2:  [93 %-100 %] 96 % (02/18 0400) Weight:  [103.5 kg] 103.5 kg (02/18 0539)  Filed Weights   05/13/18 0630 05/14/18 0500 05/15/18 0539  Weight: 106 kg 105.1 kg 103.5 kg    Weight change: -1.598 kg   Intake/Output from previous day: 02/17 0701 - 02/18 0700 In: 837.3 [P.O.:480; I.V.:357.3] Out: 2440 [Urine:2440]  Intake/Output this shift: No intake/output data recorded.  Current Meds: Scheduled Meds: . acetaminophen  1,000 mg Oral Q6H  . aspirin EC  325 mg Oral Daily  . atorvastatin  80 mg Oral q1800  . bisacodyl  10 mg Oral Daily   Or  . bisacodyl  10 mg Rectal Daily  . Chlorhexidine Gluconate Cloth  6 each Topical Daily  . cycloSPORINE  1 drop Both Eyes BID  . docusate sodium  200 mg Oral Daily  . enoxaparin (LOVENOX) injection  30 mg Subcutaneous QHS  . furosemide  80 mg Intravenous Daily  . guaiFENesin  600 mg Oral BID  . insulin aspart  0-15 Units Subcutaneous TID WC  . insulin aspart  0-5 Units Subcutaneous QHS  .  mouth rinse  15 mL Mouth Rinse BID  . metoCLOPramide (REGLAN) injection  5 mg Intravenous Q6H  . pantoprazole  40 mg Oral Daily  . simethicone  80 mg Oral QID  . sodium chloride flush  10-40 mL Intracatheter Q12H  . sodium chloride flush  3 mL Intravenous Q12H   Continuous Infusions: . sodium chloride Stopped (05/11/18 0838)  . sodium chloride 250 mL (05/14/18 2234)  . sodium chloride 10 mL/hr at 05/14/18 1800  . DOPamine Stopped (05/14/18 9147)  . milrinone 0.1 mcg/kg/min (05/14/18 1800)   PRN Meds:.sodium chloride, ipratropium-albuterol, metoprolol tartrate, ondansetron (ZOFRAN) IV, oxyCODONE, sodium chloride flush, sodium chloride flush  General appearance: alert, cooperative and no distress Neurologic: intact Heart: V paced Lungs: Expiratory wheezing, diminished bibasilar breath sounds Abdomen: Soft, protuberant, non tender, bowel sounds Extremities: Bilateral LE edema Wound: Clean and dry  Lab Results: CBC: Recent Labs    05/14/18 0522 05/15/18 0449  WBC 10.0 10.7*  HGB 9.0* 9.4*  HCT 28.6* 30.5*  PLT 146* 180   BMET:  Recent Labs    05/14/18 0522 05/15/18 0449  NA 137 141  K 3.7 3.5  CL 107 108  CO2 20* 24  GLUCOSE 112* 106*  BUN 67* 59*  CREATININE 2.91* 2.11*  CALCIUM 8.4* 8.4*    CMET: Lab Results  Component Value Date   WBC 10.7 (H) 05/15/2018   HGB 9.4 (L) 05/15/2018   HCT 30.5 (L) 05/15/2018   PLT 180 05/15/2018   GLUCOSE 106 (H) 05/15/2018   ALT 23 05/14/2018   AST 27 05/14/2018   NA 141 05/15/2018   K 3.5 05/15/2018   CL 108 05/15/2018   CREATININE 2.11 (H) 05/15/2018   BUN 59 (H) 05/15/2018   CO2 24 05/15/2018   INR 1.39 05/10/2018   HGBA1C 5.5 05/10/2018    PT/INR: No results for input(s): LABPROT, INR in the last 72 hours. Radiology: No results found.   Assessment/Plan: S/P Procedure(s) (LRB): CORONARY ARTERY BYPASS GRAFTING (CABG) x three, using left internal mammary artery and right leg greater saphenous vein harvested  endoscopically and placement of left ventricular epicardial pacermaker lead (N/A) TRANSESOPHAGEAL ECHOCARDIOGRAM (TEE) (N/A)  1. CV-V paced. on very low dose Milrinone this am. Co ox 64.7. Wean off Milrinone.  2. Pulmonary-History of moderate COPD. On 2 liters of oxygen via [place]. CXR this am appears to show cardiomegaly, chronically elevated left hemidiaphragm, bibasilar atelectasis and vascular congestion. Encourage incentive spirometer. 3. Acute on chronic CHF-on Lasix 80 mg IV but will give two times today 4. ABL anemia-H and H this am stable at 9.4 and 30.5 5. Gently supplement potassium 6. CBGs 124/117/110. Pre op HGA1C 5.5. Will stop accu checks and SS 7. Acute kidney injury caused by ATN +/- IV contrast-creatinine decreased to 2.11 8. Transfer to 4E  Donielle Margaretann Loveless PA-C 05/15/2018 7:46 AM    I have seen and examined the patient and agree with the assessment and plan as outlined.  Making good progress.  Transfer 4E  Purcell Nails, MD 05/15/2018 11:47 AM

## 2018-05-15 NOTE — Discharge Instructions (Addendum)

## 2018-05-15 NOTE — Plan of Care (Signed)
Care plan has been reviewed.  Problem: Pain Managment: post CABGx 3, post op day 5th. Goal: General experience of comfort will improve Outcome: Progressing: pain is well controlled. Pt sleeps well tonight.   Problem: Elimination: increase frequency of urination due to aggressive diuretic. Goal: Will not experience complications related to urinary retention Outcome: Progressing: uses urinal and requires minimal assisted.   Problem: Elimination: constipation, last bowel movement is 05/09/2018 ( 7 days ago) Goal: Will not experience complications related to bowel motility Outcome: Progressing: pt has been passing lots of gas, he already got stool softener and laxative med at daytime. Will monitor.   Problem: Activity: limited movent with sternal precaution. Goal: Risk for activity intolerance will decrease Outcome: Progressing: Pt worked with PT,well tolerated ambulation in hall way today.  Problem: Clinical Measurements: shortness of breath when ambulated, x-ray showed basilar bilateral atelectasis, on O2 NCL 2-3 LPM, SPO2 97%, auscultated: rhonchi and crepitation with minimal expiratory wheezing. Pt had strong but non productive cough. Goal: Respiratory complications will improve     Outcome: Progressing: encouraged breathing exercise with incentive spirometer,no distress.  Problem: Clinical Measurements: EKG: A-V paced and V-paced on monitor with unifocal occasional PVC, HR 80s. with permanant pacemaker implanted last year. Goal: Cardiovascular complication will be avoided Outcome: Progressing: hemodynamic remains stable. Will continue to monitor.    [name], BSN,RN,PCCN-CMC

## 2018-05-15 NOTE — Progress Notes (Signed)
Patient arrived the unit from [facility] on a wheelchair, placed on tele ccmd notified, assessment completed see flowsheet, patient oriented to room and staff, bed in lowest position, call bell within reach will continue to monitor.

## 2018-05-16 ENCOUNTER — Inpatient Hospital Stay (HOSPITAL_COMMUNITY): Payer: Medicare Other

## 2018-05-16 LAB — CBC
HCT: 29.8 % — ABNORMAL LOW (ref 39.0–52.0)
Hemoglobin: 9.4 g/dL — ABNORMAL LOW (ref 13.0–17.0)
MCH: 29.9 pg (ref 26.0–34.0)
MCHC: 31.5 g/dL (ref 30.0–36.0)
MCV: 94.9 fL (ref 80.0–100.0)
Platelets: 193 10*3/uL (ref 150–400)
RBC: 3.14 MIL/uL — ABNORMAL LOW (ref 4.22–5.81)
RDW: 14.6 % (ref 11.5–15.5)
WBC: 10.3 10*3/uL (ref 4.0–10.5)
nRBC: 0 % (ref 0.0–0.2)

## 2018-05-16 LAB — BASIC METABOLIC PANEL
Anion gap: 7 (ref 5–15)
BUN: 51 mg/dL — ABNORMAL HIGH (ref 8–23)
CO2: 30 mmol/L (ref 22–32)
Calcium: 8.4 mg/dL — ABNORMAL LOW (ref 8.9–10.3)
Chloride: 109 mmol/L (ref 98–111)
Creatinine, Ser: 1.64 mg/dL — ABNORMAL HIGH (ref 0.61–1.24)
GFR calc Af Amer: 48 mL/min — ABNORMAL LOW (ref >60)
GFR calc non Af Amer: 41 mL/min — ABNORMAL LOW (ref >60)
Glucose, Bld: 110 mg/dL — ABNORMAL HIGH (ref 70–99)
Potassium: 3.6 mmol/L (ref 3.5–5.1)
Sodium: 146 mmol/L — ABNORMAL HIGH (ref 135–145)

## 2018-05-16 MED ORDER — POTASSIUM CHLORIDE CRYS ER 20 MEQ PO TBCR
40.0000 meq | EXTENDED_RELEASE_TABLET | Freq: Three times a day (TID) | ORAL | Status: AC
  Administered 2018-05-16 (×3): 40 meq via ORAL
  Filled 2018-05-16 (×3): qty 2

## 2018-05-16 MED ORDER — ENSURE ENLIVE PO LIQD
237.0000 mL | Freq: Three times a day (TID) | ORAL | Status: DC
  Administered 2018-05-16: 237 mL via ORAL

## 2018-05-16 MED ORDER — CARVEDILOL 6.25 MG PO TABS
6.2500 mg | ORAL_TABLET | Freq: Two times a day (BID) | ORAL | Status: DC
  Administered 2018-05-16 – 2018-05-17 (×3): 6.25 mg via ORAL
  Filled 2018-05-16 (×3): qty 1

## 2018-05-16 MED ORDER — ASPIRIN EC 81 MG PO TBEC
81.0000 mg | DELAYED_RELEASE_TABLET | Freq: Every day | ORAL | Status: DC
  Administered 2018-05-16 – 2018-05-17 (×2): 81 mg via ORAL
  Filled 2018-05-16 (×2): qty 1

## 2018-05-16 MED ORDER — FUROSEMIDE 40 MG PO TABS
40.0000 mg | ORAL_TABLET | Freq: Two times a day (BID) | ORAL | Status: DC
  Administered 2018-05-16 – 2018-05-17 (×3): 40 mg via ORAL
  Filled 2018-05-16 (×3): qty 1

## 2018-05-16 NOTE — Care Management Important Message (Signed)
Important Message  Patient Details  Name: Brian [name] MRN: [id number] Date of Birth: May 06, 1945   Medicare Important Message Given:  Yes    Oralia Rud Jeramy Dimmick 05/16/2018, 12:21 PM

## 2018-05-16 NOTE — Progress Notes (Signed)
CARDIAC REHAB PHASE I     POST:  Rate/Rhythm: 84    SaO2: 93 3L   Pt just returned to room from ambulation with NT. Pt denies pain or SOB. O2 sats stable on 3L, turned oxygen down to 2L, RN made aware. Pt walked three times today. Encouraged IS use. Will continue to follow.  [id number] Reynold Bowen, RN BSN 05/16/2018 1:50 PM

## 2018-05-16 NOTE — Progress Notes (Addendum)
[address]       [place] [address]             [phone]      6 Days Post-Op Procedure(s) (LRB): CORONARY ARTERY BYPASS GRAFTING (CABG) x three, using left internal mammary artery and right leg greater saphenous vein harvested endoscopically and placement of left ventricular epicardial pacermaker lead (N/A) TRANSESOPHAGEAL ECHOCARDIOGRAM (TEE) (N/A)   Subjective:  No new complaints.  Feeling pretty good.  Has been up walking already, got cleaned up, and shaved.  + BM  Objective: Vital signs in last 24 hours: Temp:  [98.2 F (36.8 C)-98.7 F (37.1 C)] 98.2 F (36.8 C) (02/19 0736) Pulse Rate:  [41-86] 82 (02/19 0736) Cardiac Rhythm: Ventricular paced (02/19 0700) Resp:  [16-36] 24 (02/19 0736) BP: (122-157)/(47-81) 136/80 (02/19 0736) SpO2:  [88 %-99 %] 97 % (02/19 0736) Weight:  [99.1 kg] 99.1 kg (02/19 0341)  Intake/Output from previous day: 02/18 0701 - 02/19 0700 In: 1613 [P.O.:1610; I.V.:3] Out: 2846 [Urine:2846]  General appearance: alert, cooperative and no distress Heart: regular rate and rhythm Lungs: clear to auscultation bilaterally Abdomen: soft, non-tender; bowel sounds normal; no masses,  no organomegaly Extremities: edema trace to 1+ Wound: clean and dry  Lab Results: Recent Labs    05/15/18 0449 05/16/18 0401  WBC 10.7* 10.3  HGB 9.4* 9.4*  HCT 30.5* 29.8*  PLT 180 193   BMET:  Recent Labs    05/15/18 0449 05/15/18 1400 05/16/18 0401  NA 141  --  146*  K 3.5 3.9 3.6  CL 108  --  109  CO2 24  --  30  GLUCOSE 106*  --  110*  BUN 59*  --  51*  CREATININE 2.11*  --  1.64*  CALCIUM 8.4*  --  8.4*    PT/INR: No results for input(s): LABPROT, INR in the last 72 hours. ABG    Component Value Date/Time   PHART 7.283 (L) 05/11/2018 0524   HCO3 20.7 05/11/2018 0524   TCO2 23 05/10/2018 1711   ACIDBASEDEF 4.9 (H) 05/11/2018 0524   O2SAT 64.7 05/15/2018 0500   CBG (last 3)  Recent Labs    05/14/18 1610  05/14/18 2155 05/15/18 0705  GLUCAP 124* 117* 110*    Assessment/Plan: S/P Procedure(s) (LRB): CORONARY ARTERY BYPASS GRAFTING (CABG) x three, using left internal mammary artery and right leg greater saphenous vein harvested endoscopically and placement of left ventricular epicardial pacermaker lead (N/A) TRANSESOPHAGEAL ECHOCARDIOGRAM (TEE) (N/A)  1. CV- V Paced. Milrinone off, mild HTN- Coreg has been ordered to start today 2. Pulm- wean oxygen as tolerated, per patient using mostly at night, cant tolerate CPAP 3. Renal- AKI, improving, creatinine down to 1.64, remains volume overloaded on exam, continue Lasix 40 mg daily 4. Expected post operative blood loss anemia, mild Hgb 9.4 5. Dispo- patient stable, V Pacing, Coreg has been ordered to start today, continue diuretics, possibly ready for d/c in 24-48 hours   LOS: 6 days    Lowella Dandy 05/16/2018  I have seen and examined the patient and agree with the assessment and plan as outlined.  Looks good and reports having a good night.  Weight down close to preop baseline.  Creatinine trending down.   Brief episode Aflutter yesterday afternoon, currently sinus w/ Vpacing and frequent PAC's and PVC's.  Will restart carvedilol at reduced dose.  Supplement potassium.  Wean O2 as tolerated.  Consider resuming ARB depending on renal function and  BP over next 24 hours.  Possible d/c home soon if able to wean O2 off.  Will need early f/u with Cardiology team due to severity of LV dysfunction and CHF.  Purcell Nails, MD 05/16/2018 8:44 AM

## 2018-05-16 NOTE — Progress Notes (Signed)
Pt walked 470 ft with frontwheel walker, standby assisted in a hall way with O2 3 LPM, well tolerated.  At room air at rest, his SPO2 was 88%, with 3 LPM, his SPO2 was 97%. HR while he was walking 90s, V- paced on monitor. Continue to monitor.  [name], BSN,RN,PCCN-CMC

## 2018-05-17 ENCOUNTER — Ambulatory Visit (HOSPITAL_COMMUNITY): Admit: 2018-05-17 | Payer: Medicare Other | Admitting: Internal Medicine

## 2018-05-17 ENCOUNTER — Encounter (HOSPITAL_COMMUNITY): Payer: Self-pay

## 2018-05-17 LAB — BASIC METABOLIC PANEL
Anion gap: 10 (ref 5–15)
BUN: 42 mg/dL — ABNORMAL HIGH (ref 8–23)
CO2: 28 mmol/L (ref 22–32)
Calcium: 8.7 mg/dL — ABNORMAL LOW (ref 8.9–10.3)
Chloride: 109 mmol/L (ref 98–111)
Creatinine, Ser: 1.54 mg/dL — ABNORMAL HIGH (ref 0.61–1.24)
GFR calc Af Amer: 51 mL/min — ABNORMAL LOW (ref >60)
GFR calc non Af Amer: 44 mL/min — ABNORMAL LOW (ref >60)
Glucose, Bld: 122 mg/dL — ABNORMAL HIGH (ref 70–99)
Potassium: 3.9 mmol/L (ref 3.5–5.1)
Sodium: 147 mmol/L — ABNORMAL HIGH (ref 135–145)

## 2018-05-17 SURGERY — BIV PACEMAKER INSERTION CRT-P

## 2018-05-17 MED ORDER — FUROSEMIDE 40 MG PO TABS: 40.0000 mg | ORAL_TABLET | Freq: Two times a day (BID) | ORAL | Status: DC

## 2018-05-17 MED ORDER — ATORVASTATIN CALCIUM 80 MG PO TABS: 80.0000 mg | ORAL_TABLET | Freq: Every day | ORAL | 1 refills | Status: DC

## 2018-05-17 MED ORDER — POTASSIUM 99 MG PO TABS: 99.0000 mg | ORAL_TABLET | Freq: Every day | ORAL | 0 refills | Status: DC

## 2018-05-17 MED ORDER — TRAMADOL HCL 50 MG PO TABS: 50.0000 mg | ORAL_TABLET | Freq: Three times a day (TID) | ORAL | 0 refills | Status: DC | PRN

## 2018-05-17 MED ORDER — CARVEDILOL 6.25 MG PO TABS: 6.2500 mg | ORAL_TABLET | Freq: Two times a day (BID) | ORAL | 1 refills | Status: DC

## 2018-05-17 MED ORDER — CYCLOSPORINE 0.05 % OP EMUL: 1.0000 [drp] | Freq: Two times a day (BID) | OPHTHALMIC | Status: AC

## 2018-05-17 MED ORDER — POTASSIUM CHLORIDE CRYS ER 20 MEQ PO TBCR
40.0000 meq | EXTENDED_RELEASE_TABLET | Freq: Once | ORAL | Status: AC
  Administered 2018-05-17: 40 meq via ORAL
  Filled 2018-05-17: qty 2

## 2018-05-17 NOTE — Progress Notes (Signed)
CARDIAC REHAB PHASE I   Pt has been ambulating independently this am without oxygen. Pt denies CP or SOB. Pt and wife instructed to shower and monitor incisions daily. Encouraged continued IS use, walks, and sternal precautions. Pt given in-the-tube sheet, heart healthy and low sodium diets, and CHF booklet. Reviewed restrictions and exercise guidelines. Stressed importance of gradual progress and not overexerting himself. Will refer to CRP II [place].  [id number] Reynold Bowen, RN BSN 05/17/2018 10:14 AM

## 2018-05-17 NOTE — Progress Notes (Signed)
Pt ambulated 470 ft with stand-by assist. Pt 95% RA throughout walk. Pt tolerated well, pt up in chair. Will continue to monitor. Theophilus Kinds, RN

## 2018-05-17 NOTE — Progress Notes (Signed)
      [address]       [place] [address]             [phone]        7 Days Post-Op Procedure(s) (LRB): CORONARY ARTERY BYPASS GRAFTING (CABG) x three, using left internal mammary artery and right leg greater saphenous vein harvested endoscopically and placement of left ventricular epicardial pacermaker lead (N/A) TRANSESOPHAGEAL ECHOCARDIOGRAM (TEE) (N/A)  Subjective: Patient already walked this morning. He has no complaints and wants to go home.  Objective: Vital signs in last 24 hours: Temp:  [97.9 F (36.6 C)-98.5 F (36.9 C)] 97.9 F (36.6 C) (02/20 0258) Pulse Rate:  [69-82] 78 (02/20 0258) Cardiac Rhythm: Ventricular paced (02/20 0700) Resp:  [16-26] 21 (02/20 0258) BP: (124-136)/(48-80) 124/75 (02/20 0258) SpO2:  [96 %-100 %] 97 % (02/20 0258) Weight:  [96.8 kg] 96.8 kg (02/20 0257)  Pre op weight 98.4 kg Current Weight  05/17/18 96.8 kg       Intake/Output from previous day: 02/19 0701 - 02/20 0700 In: 1800 [P.O.:1800] Out: 855 [Urine:735; Stool:120]   Physical Exam:  Cardiovascular: V paced Pulmonary: Clear to auscultation bilaterally Abdomen: Soft, non tender, bowel sounds present. Extremities: Trace bilateral lower extremity edema. Wounds: Clean and dry.  No erythema or signs of infection.  Lab Results: CBC: Recent Labs    05/15/18 0449 05/16/18 0401  WBC 10.7* 10.3  HGB 9.4* 9.4*  HCT 30.5* 29.8*  PLT 180 193   BMET:  Recent Labs    05/15/18 0449 05/15/18 1400 05/16/18 0401  NA 141  --  146*  K 3.5 3.9 3.6  CL 108  --  109  CO2 24  --  30  GLUCOSE 106*  --  110*  BUN 59*  --  51*  CREATININE 2.11*  --  1.64*  CALCIUM 8.4*  --  8.4*    PT/INR:  Lab Results  Component Value Date   INR 1.39 05/10/2018   INR 1.08 05/08/2018   ABG:  INR: Will add last result for INR, ABG once components are confirmed Will add last 4 CBG results once components are confirmed  Assessment/Plan:  1. CV - Last had a  flutter Tuesday afternoon. V pacing,PACs/PVCs of late. On Coreg 6.25 mg bid 2.  Pulmonary - On 1 liter of oxygen via [place]. Attempt to wean to room air. Encourage incentive spirometer. 3. Volume Overload - On Lasix 40 mg bid 4.  Acute blood loss anemia - H and H yesterday 9.4 and 29.8 5. Acute kidney injury caused by ATN+/- IV contrast-creatinine decreased to 1.64. Will check BMET today 6. Will re evaluate mid morning-if off oxygen, possible discharge  Lelon Huh ZimmermanPA-C 05/17/2018,7:32 AM [phone]

## 2018-05-17 NOTE — Care Management Note (Signed)
Case Management Note Donn Pierini RN, BSN Transitions of Care Unit 4E- RN Case Manager [phone]  Patient Details  Name: Brian [name] MRN: [id number] Date of Birth: 1945/08/07  Subjective/Objective:   Pt admitted s/p CABG x3 with placement of left ventricular epicardial pacermaker lead                  Action/Plan: PTA pt lived at home with wife, plan to return home with spouse, ambulating with stand-by assist, no CM needs noted for transition home.    Expected Discharge Date:  05/17/18               Expected Discharge Plan:  Home/Self Care  In-House Referral:  NA  Discharge planning Services  CM Consult  Post Acute Care Choice:  NA Choice offered to:  NA  DME Arranged:    DME Agency:     HH Arranged:    HH Agency:     Status of Service:  Completed, signed off  If discussed at Long Length of Stay Meetings, dates discussed:    Discharge Disposition: home/self care   Additional Comments:  Darrold Span, RN 05/17/2018, 9:41 AM

## 2018-05-18 ENCOUNTER — Encounter: Payer: Self-pay | Admitting: Nurse Practitioner

## 2018-05-18 NOTE — Progress Notes (Signed)
CareAlert received for new AF burden > threshold. Pt is s/p CABG 05/10/18. Longest AF episode 3 hours and in the setting of bypass surgery. CHADS2VASC is 3. Will follow for now. If burden increases, will need to consider OAC. Forwarding to Dr Graciela Husbands for review as well.   I have also sent a message to the scheduling team to cancel wound check and 3 month appt with Dr Ladona Ridgel (as we did not upgrade device). He needs 3 month follow up with Dr Graciela Husbands.  Will need EF reassessed at that point post revascularization  Brian Balsam, NP 05/18/2018 8:36 AM

## 2018-05-21 ENCOUNTER — Ambulatory Visit: Payer: Medicare Other

## 2018-05-21 NOTE — Progress Notes (Signed)
Noted. thx 

## 2018-05-22 ENCOUNTER — Telehealth (HOSPITAL_COMMUNITY): Payer: Self-pay

## 2018-05-22 NOTE — Telephone Encounter (Signed)
Attempted to call patient in regards to Cardiac Rehab - LM on VM °[name] Support Rep II °

## 2018-05-22 NOTE — Telephone Encounter (Signed)
Pt insurance is active and benefits verified through [employer] $0.00, DED $0.00/0 met, out of pocket $3,900/$86.52 met, co-insurance 0%. no pre-authorization required. [employer]port, 05/22/2018 @ 10:30am, REF# [id number]  Will contact patient to see if he is interested in the Cardiac Rehab Program. If interested, patient will need to complete follow up appt. Once completed, patient will be contacted for scheduling upon review by the RN Navigator.

## 2018-05-24 NOTE — Research (Addendum)
[facility] Research study [employer] V2: Address abnormal labs

## 2018-05-24 NOTE — Research (Signed)
[facility] Research study V3 Central labs:

## 2018-05-24 NOTE — Research (Addendum)
[facility] Research study V1 Screening central labs: Address abnormal

## 2018-05-24 NOTE — Research (Signed)
[facility] Research study V4 and V5 Central labs

## 2018-05-25 ENCOUNTER — Ambulatory Visit (INDEPENDENT_AMBULATORY_CARE_PROVIDER_SITE_OTHER): Payer: Self-pay

## 2018-05-25 ENCOUNTER — Other Ambulatory Visit: Payer: Self-pay

## 2018-05-25 DIAGNOSIS — Z4802 Encounter for removal of sutures: Secondary | ICD-10-CM

## 2018-05-25 NOTE — Progress Notes (Signed)
Patient arrived for nurse visit to remove 3 sutures post- procedure CABG x3 04/30/2018 with Dr. Cornelius Moras.  Sutures removed with no signs/ symptoms of infection noted.  Patient tolerated procedure well.  Patient/ family instructed to keep the incision sites clean and dry and use soap and water to clean and pat dry.  Patient/ family acknowledged instructions given.  Advised patient and family to keep watch on the incisions and call if there were any problems.  They acknowledged receipt and are aware of their follow-up appointments.
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
## 2018-05-28 ENCOUNTER — Ambulatory Visit: Payer: Medicare Other | Admitting: Cardiology

## 2018-05-28 ENCOUNTER — Encounter: Payer: Self-pay | Admitting: Cardiology

## 2018-05-28 VITALS — BP 126/70 | HR 73 | Ht 70.0 in | Wt 210.0 lb

## 2018-05-28 DIAGNOSIS — I442 Atrioventricular block, complete: Secondary | ICD-10-CM | POA: Diagnosis not present

## 2018-05-28 DIAGNOSIS — Z951 Presence of aortocoronary bypass graft: Secondary | ICD-10-CM | POA: Diagnosis not present

## 2018-05-28 DIAGNOSIS — Z950 Presence of cardiac pacemaker: Secondary | ICD-10-CM | POA: Diagnosis not present

## 2018-05-28 DIAGNOSIS — I2510 Atherosclerotic heart disease of native coronary artery without angina pectoris: Secondary | ICD-10-CM

## 2018-05-28 DIAGNOSIS — I10 Essential (primary) hypertension: Secondary | ICD-10-CM

## 2018-05-28 DIAGNOSIS — I255 Ischemic cardiomyopathy: Secondary | ICD-10-CM

## 2018-05-28 DIAGNOSIS — E782 Mixed hyperlipidemia: Secondary | ICD-10-CM

## 2018-05-28 DIAGNOSIS — I5023 Acute on chronic systolic (congestive) heart failure: Secondary | ICD-10-CM | POA: Diagnosis not present

## 2018-05-28 NOTE — Patient Instructions (Signed)
Your physician recommends that you continue on your current medications as directed. Please refer to the Current Medication list given to you today.   Your physician recommends that you return for lab work in: TODAY  BMET AND CBC  Your physician recommends that you schedule a follow-up appointment in:  2 WEEKS WITH APP  10 WEEKS WITH DR Katrinka Blazing

## 2018-05-29 LAB — CBC WITH DIFFERENTIAL/PLATELET
Basophils Absolute: 0.1 10*3/uL (ref 0.0–0.2)
Basos: 1 %
EOS (ABSOLUTE): 0.2 10*3/uL (ref 0.0–0.4)
Eos: 3 %
Hematocrit: 34 % — ABNORMAL LOW (ref 37.5–51.0)
Hemoglobin: 10.8 g/dL — ABNORMAL LOW (ref 13.0–17.7)
Immature Grans (Abs): 0 10*3/uL (ref 0.0–0.1)
Immature Granulocytes: 0 %
Lymphocytes Absolute: 1.5 10*3/uL (ref 0.7–3.1)
Lymphs: 21 %
MCH: 29.7 pg (ref 26.6–33.0)
MCHC: 31.8 g/dL (ref 31.5–35.7)
MCV: 93 fL (ref 79–97)
Monocytes Absolute: 0.8 10*3/uL (ref 0.1–0.9)
Monocytes: 11 %
Neutrophils Absolute: 4.6 10*3/uL (ref 1.4–7.0)
Neutrophils: 64 %
Platelets: 342 10*3/uL (ref 150–450)
RBC: 3.64 x10E6/uL — ABNORMAL LOW (ref 4.14–5.80)
RDW: 14.7 % (ref 11.6–15.4)
WBC: 7.2 10*3/uL (ref 3.4–10.8)

## 2018-05-29 LAB — BASIC METABOLIC PANEL
BUN/Creatinine Ratio: 12 (ref 10–24)
BUN: 16 mg/dL (ref 8–27)
CO2: 26 mmol/L (ref 20–29)
Calcium: 8.8 mg/dL (ref 8.6–10.2)
Chloride: 99 mmol/L (ref 96–106)
Creatinine, Ser: 1.35 mg/dL — ABNORMAL HIGH (ref 0.76–1.27)
GFR calc Af Amer: 60 mL/min/{1.73_m2} (ref >59)
GFR calc non Af Amer: 52 mL/min/{1.73_m2} — ABNORMAL LOW (ref >59)
Glucose: 89 mg/dL (ref 65–99)
Potassium: 4.5 mmol/L (ref 3.5–5.2)
Sodium: 143 mmol/L (ref 134–144)

## 2018-05-30 ENCOUNTER — Telehealth: Payer: Self-pay | Admitting: *Deleted

## 2018-05-30 NOTE — Telephone Encounter (Signed)
Called pt with lab results, left a message for him to call back.

## 2018-05-30 NOTE — Telephone Encounter (Signed)
-----   Message from Leone Brand, NP sent at 05/29/2018  9:17 PM EST ----- His anemia is improving. Electrolytes are good and his kidney function is better.  All good.

## 2018-06-01 ENCOUNTER — Other Ambulatory Visit: Payer: Self-pay | Admitting: Thoracic Surgery (Cardiothoracic Vascular Surgery)

## 2018-06-01 DIAGNOSIS — Z951 Presence of aortocoronary bypass graft: Secondary | ICD-10-CM

## 2018-06-04 ENCOUNTER — Other Ambulatory Visit: Payer: Self-pay

## 2018-06-04 ENCOUNTER — Ambulatory Visit
Admission: RE | Admit: 2018-06-04 | Discharge: 2018-06-04 | Disposition: A | Discharge status: Home / Self Care | Payer: Medicare Other | Source: Ambulatory Visit | Attending: Thoracic Surgery (Cardiothoracic Vascular Surgery) | Admitting: Thoracic Surgery (Cardiothoracic Vascular Surgery)

## 2018-06-04 ENCOUNTER — Ambulatory Visit (INDEPENDENT_AMBULATORY_CARE_PROVIDER_SITE_OTHER): Payer: Self-pay | Admitting: Physician Assistant

## 2018-06-04 ENCOUNTER — Encounter: Payer: Self-pay | Admitting: *Deleted

## 2018-06-04 VITALS — BP 111/74 | HR 79 | Resp 18 | Ht 70.0 in | Wt 206.8 lb

## 2018-06-04 DIAGNOSIS — Z9889 Other specified postprocedural states: Secondary | ICD-10-CM

## 2018-06-04 DIAGNOSIS — Z951 Presence of aortocoronary bypass graft: Secondary | ICD-10-CM | POA: Diagnosis not present

## 2018-06-04 DIAGNOSIS — J986 Disorders of diaphragm: Secondary | ICD-10-CM | POA: Diagnosis not present

## 2018-06-04 DIAGNOSIS — Z006 Encounter for examination for normal comparison and control in clinical research program: Secondary | ICD-10-CM

## 2018-06-04 NOTE — Patient Instructions (Signed)
You may return to driving an automobile as long as you are no longer requiring oral narcotic pain relievers during the daytime.  It would be wise to start driving only short distances during the daylight and gradually increase from there as you feel comfortable.  ? ?You are encouraged to enroll and participate in the outpatient cardiac rehab program beginning as soon as practical.  ? ?You may continue to gradually increase your physical activity as tolerated.  Refrain from any heavy lifting or strenuous use of your arms and shoulders until at least 8 weeks from the time of your surgery, and avoid activities that cause increased pain in your chest on the side of your surgical incision.  Otherwise, you may continue to increase activities without any particular limitations.  Increase the intensity and duration of physical activity gradually.  ?

## 2018-06-04 NOTE — Research (Signed)
[facility] Research study 30 day follow up. ICFversion 3.0 signed and copy given to patient. No changes in medication or adverse events. Patient doing well.

## 2018-06-04 NOTE — Progress Notes (Addendum)
HPI:  Patient returns for routine postoperative follow-up having undergone a CABG x 3 and LV epicardial leads x 2 05/10/2018 by Dr. Cornelius Moras. Since hospital discharge, the patient reports he no longer has shortness of breath. He also denies chest pain, fever, or palpitations. 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   Current Outpatient Medications  Medication Sig Dispense Refill  . acetaminophen (TYLENOL) 500 MG tablet Take 500 mg by mouth every 6 (six) hours as needed.    . Ascorbic Acid (VITAMIN C) 1000 MG tablet Take 1,000 mg by mouth daily.    Marland Kitchen aspirin EC 81 MG tablet Take 81 mg by mouth daily.    Marland Kitchen atorvastatin (LIPITOR) 80 MG tablet Take 1 tablet (80 mg total) by mouth daily at 6 PM. 30 tablet 1  . carvedilol (COREG) 6.25 MG tablet Take 1 tablet (6.25 mg total) by mouth 2 (two) times daily. 60 tablet 1  . cycloSPORINE (RESTASIS) 0.05 % ophthalmic emulsion Place 1 drop into both eyes 2 (two) times daily. 0.4 mL   . furosemide (LASIX) 40 MG tablet Take 1 tablet (40 mg total) by mouth 2 (two) times daily.    . Multiple Vitamins-Minerals (EQ VISION FORMULA 50+) CAPS Take 1 capsule by mouth daily.    . Potassium 99 MG TABS Take 1 tablet (99 mg total) by mouth daily. 330 each 0  Vital Signs: BP 111/74, HR 79, RR 18, Oxygenation 99% on room air   Physical Exam: CV-RRR Pulmonary-Clear to auscultation bilaterally Abdomen-Soft, non tender, bowel sounds present Extremities-Trace right lower extremity edema Wounds-Clean and dry. Eschars at chest tube sites.  Diagnostic Tests: CLINICAL DATA:  Status post CABG.  Shortness of breath.  EXAM: CHEST - 2 VIEW  COMPARISON:  05/16/2018  FINDINGS: There is a left chest wall pacer device with leads in the right atrial appendage and right ventricle. Marked asymmetric elevation of left hemidiaphragm noted. No pleural effusion or edema. No airspace opacities  IMPRESSION: 1. No acute cardiopulmonary abnormalities. 2. Marked asymmetric  elevation of left hemidiaphragm as before.   Electronically Signed   By: Signa Kell M.D.   On: 06/04/2018 12:29  Impression and Plan: Overall, Mr. Strebeck is recovering from coronary artery bypass grafting surgery and LV epicardial leads x 2 on 05/10/2018. He has already has seen Nada Boozer NP from cardiology on 03/02. She made no changes to his medications, but is considering restarting Losartan at some point. His BMET from 03/02 showed creatinine decreased from 1.54 to 1.35 (he has CKD stage II). I have instructed him to take his potassium supplement every other day is potassium is up to 4.5. His anemia has improved since surgery. His H and H is up to 10.8 and 34. Vernona Rieger will arrange for patient to have follow up echo to determine heart function;if still decreased, may need to consider BIV ICD (has PPM now). Patient walks everyday. He is only taking Tylenol for pain. He was instructed he may begin driving short distances (30 minute or less during the day) until Friday or Saturday of this week. He then may then increase his frequency and duration as tolerates. He was encouraged to participate in cardiac rehab, which he is agreeable. He was instructed to continue with sternal precautions (no lifting more than 10 pounds for the next 3-4 weeks). He wants to return to work as soon as cleared by Dr. Cornelius Moras. He will return in 4 weeks to see Dr. Cornelius Moras.    Ardelle Balls,  PA-C [facility] [phone]

## 2018-06-06 ENCOUNTER — Other Ambulatory Visit: Payer: Self-pay | Admitting: *Deleted

## 2018-06-06 DIAGNOSIS — Z951 Presence of aortocoronary bypass graft: Secondary | ICD-10-CM

## 2018-06-07 ENCOUNTER — Encounter: Payer: Self-pay | Admitting: Cardiology

## 2018-06-08 NOTE — Telephone Encounter (Signed)
-----   Message from Laura R Ingold, NP sent at 05/29/2018  9:17 PM EST ----- His anemia is improving. Electrolytes are good and his kidney function is better.  All good. 

## 2018-06-08 NOTE — Telephone Encounter (Signed)
The patient has been notified of the result and verbalized understanding.  All questions (if any) were answered. Danielle Rankin, [facility] 06/08/2018 10:56 AM   Pt asked if there was anything he could do to help bring his iron level up. I asked pt if he was taking an iron supplement. Pt then asked for me to please also s/w his wife. Wife got on the phone and I went over the results and asked her if pt was taking an iron supplement. Pt's wife said no but they could do that. I advised her check with PCP and see if they want pt to start an iron supp. Pt's wife also said to me that the pt's tempeture is now 95.7 and is this normal. I advised her to call PCP today in regards to temperature. Pt's wife thanked me for the call and the help. I will forward results to PCP Dr. Pete Glatter.

## 2018-06-11 ENCOUNTER — Encounter: Payer: Self-pay | Admitting: *Deleted

## 2018-06-11 DIAGNOSIS — D649 Anemia, unspecified: Secondary | ICD-10-CM | POA: Diagnosis not present

## 2018-06-12 ENCOUNTER — Encounter: Payer: Self-pay | Admitting: *Deleted

## 2018-06-12 NOTE — Telephone Encounter (Signed)
Attempted to contact pt, to let pt know we have recv'd his referral.  LMTCB

## 2018-06-13 ENCOUNTER — Telehealth: Payer: Self-pay

## 2018-06-13 ENCOUNTER — Telehealth (HOSPITAL_COMMUNITY): Payer: Self-pay

## 2018-06-13 ENCOUNTER — Ambulatory Visit (INDEPENDENT_AMBULATORY_CARE_PROVIDER_SITE_OTHER): Payer: Medicare Other | Admitting: *Deleted

## 2018-06-13 ENCOUNTER — Other Ambulatory Visit: Payer: Self-pay

## 2018-06-13 DIAGNOSIS — I442 Atrioventricular block, complete: Secondary | ICD-10-CM

## 2018-06-13 LAB — CUP PACEART REMOTE DEVICE CHECK
Battery Remaining Longevity: 118 mo
Battery Voltage: 3.01 V
Brady Statistic AP VP Percent: 12.81 %
Brady Statistic AP VS Percent: 0.08 %
Brady Statistic AS VP Percent: 75.1 %
Brady Statistic AS VS Percent: 12.01 %
Brady Statistic RA Percent Paced: 18.19 %
Brady Statistic RV Percent Paced: 87.91 %
Date Time Interrogation Session: 20200318110420
Implantable Lead Implant Date: 20190614
Implantable Lead Implant Date: 20190614
Implantable Lead Implant Date: 20200213
Implantable Lead Implant Date: 20200213
Implantable Lead Location: 753858
Implantable Lead Location: 753858
Implantable Lead Location: 753859
Implantable Lead Location: 753860
Implantable Lead Model: 3830
Implantable Lead Model: 5071
Implantable Lead Model: 5071
Implantable Lead Model: 5076
Implantable Pulse Generator Implant Date: 20190614
Lead Channel Impedance Value: 285 Ohm
Lead Channel Impedance Value: 323 Ohm
Lead Channel Impedance Value: 456 Ohm
Lead Channel Impedance Value: 475 Ohm
Lead Channel Pacing Threshold Amplitude: 0.75 V
Lead Channel Pacing Threshold Amplitude: 1 V
Lead Channel Pacing Threshold Pulse Width: 0.4 ms
Lead Channel Pacing Threshold Pulse Width: 0.4 ms
Lead Channel Sensing Intrinsic Amplitude: 4.25 mV
Lead Channel Sensing Intrinsic Amplitude: 4.25 mV
Lead Channel Sensing Intrinsic Amplitude: 4.75 mV
Lead Channel Sensing Intrinsic Amplitude: 4.75 mV
Lead Channel Setting Pacing Amplitude: 1.75 V
Lead Channel Setting Pacing Amplitude: 2.5 V
Lead Channel Setting Pacing Pulse Width: 0.6 ms
Lead Channel Setting Sensing Sensitivity: 2 mV

## 2018-06-13 NOTE — Telephone Encounter (Addendum)
COVID-19 Pre-Screening Questions:  . Have you been in contact with someone that was recently sick with fever/cough or confirmed to have the Covid19 virus?  No  *Contact with a confirmed case should stay at home, away from confirmed patient, monitor symptoms, and reach out to PCP for e-visit/additional testing.  2. Do you have any of the following symptoms [cough, fever (100.4 or greater)], and/or shortness of breath)?  NO  *ALL PTS W/ FEVER SHOULD BE REFERRED TO PCP FOR E-VISIT* ________________________________________________________________________________  Cardiac Questionnaire:    Since your last visit or hospitalization:    1. Have you been having chest pain? No   2. Have you been having shortness of breath? No   3. Have you been having increasing edema, wt gain, or increase in abdominal girth (pants fitting more tightly)? No but pt does have fluid pills   4. Have you had any passing out spells? No    *A YES to any of these questions would result in the appointment being kept.  *If all the answers to these questions are NO, we should indicate that given the current situation regarding the worldwide coronarvirus pandemic, at the recommendation of the CDC, we are looking to limit gatherings in our waiting area, and thus will reschedule their appointment beyond four weeks from today.    Patient states that overall he is doing fine. Patient and patient's wife are made that pt will stay on the schedule and thanked me for the call.

## 2018-06-13 NOTE — Telephone Encounter (Signed)
Pt wife returned called and pt wife stated that pt is very interested in the CR program.. told pt wife that we will call pt back when we reopen and start scheduling again. Tempie Donning. Support Rep II
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
## 2018-06-19 ENCOUNTER — Other Ambulatory Visit: Payer: Self-pay

## 2018-06-19 ENCOUNTER — Encounter: Payer: Self-pay | Admitting: Cardiology

## 2018-06-19 ENCOUNTER — Ambulatory Visit (INDEPENDENT_AMBULATORY_CARE_PROVIDER_SITE_OTHER): Payer: Medicare Other | Admitting: Cardiology

## 2018-06-19 VITALS — BP 92/60 | HR 74 | Ht 70.0 in | Wt 206.0 lb

## 2018-06-19 DIAGNOSIS — I255 Ischemic cardiomyopathy: Secondary | ICD-10-CM | POA: Diagnosis not present

## 2018-06-19 DIAGNOSIS — D500 Iron deficiency anemia secondary to blood loss (chronic): Secondary | ICD-10-CM

## 2018-06-19 DIAGNOSIS — I5022 Chronic systolic (congestive) heart failure: Secondary | ICD-10-CM | POA: Diagnosis not present

## 2018-06-19 DIAGNOSIS — Z950 Presence of cardiac pacemaker: Secondary | ICD-10-CM

## 2018-06-19 DIAGNOSIS — N179 Acute kidney failure, unspecified: Secondary | ICD-10-CM

## 2018-06-19 DIAGNOSIS — Z951 Presence of aortocoronary bypass graft: Secondary | ICD-10-CM

## 2018-06-19 DIAGNOSIS — E782 Mixed hyperlipidemia: Secondary | ICD-10-CM

## 2018-06-19 DIAGNOSIS — R600 Localized edema: Secondary | ICD-10-CM

## 2018-06-19 MED ORDER — LOSARTAN POTASSIUM 25 MG PO TABS: 25.0000 mg | ORAL_TABLET | Freq: Every day | ORAL | 3 refills | Status: DC

## 2018-06-19 NOTE — Patient Instructions (Addendum)
Medication Instructions:  1. DECREASE LOSARTAN TO 25 MG DAILY.   If you need a refill on your cardiac medications before your next appointment, please call your pharmacy.   Lab work: TO BE DONE ON Monday: BMET  If you have labs (blood work) drawn today and your tests are completely normal, you will receive your results only by: Marland Kitchen MyChart Message (if you have MyChart) OR . A paper copy in the mail If you have any lab test that is abnormal or we need to change your treatment, we will call you to review the results.  Testing/Procedures: Your physician has requested that you have an echocardiogram. Echocardiography is a painless test that uses sound waves to create images of your heart. It provides your doctor with information about the size and shape of your heart and how well your heart's chambers and valves are working. This procedure takes approximately one hour. There are no restrictions for this procedure. TO BE DONE IN 2 MONTHS   Follow-Up: At [facility], you and your health needs are our priority.  As part of our continuing mission to provide you with exceptional heart care, we have created designated Provider Care Teams.  These Care Teams include your primary Cardiologist (physician) and Advanced Practice Providers (APPs -  Physician Assistants and Nurse Practitioners) who all work together to provide you with the care you need, when you need it. Marland Kitchen Keep appointment with Dr. Katrinka Blazing and Dr. Graciela Husbands in May 2020. Marland Kitchen Your physician recommends that you schedule a follow-up appointment in: 2 weeks with a TeleConference with Nada Boozer, NP to check blood pressure readings.  Any Other Special Instructions Will Be Listed Below (If Applicable).   Echocardiogram An echocardiogram is a procedure that uses painless sound waves (ultrasound) to produce an image of the heart. Images from an echocardiogram can provide important information about:  Signs of coronary artery disease (CAD).  Aneurysm  detection. An aneurysm is a weak or damaged part of an artery wall that bulges out from the normal force of blood pumping through the body.  Heart size and shape. Changes in the size or shape of the heart can be associated with certain conditions, including heart failure, aneurysm, and CAD.  Heart muscle function.  Heart valve function.  Signs of a past heart attack.  Fluid buildup around the heart.  Thickening of the heart muscle.  A tumor or infectious growth around the heart valves. Tell a health care provider about:  Any allergies you have.  All medicines you are taking, including vitamins, herbs, eye drops, creams, and over-the-counter medicines.  Any blood disorders you have.  Any surgeries you have had.  Any medical conditions you have.  Whether you are pregnant or may be pregnant. What are the risks? Generally, this is a safe procedure. However, problems may occur, including:  Allergic reaction to dye (contrast) that may be used during the procedure. What happens before the procedure? No specific preparation is needed. You may eat and drink normally. What happens during the procedure?   An IV tube may be inserted into one of your veins.  You may receive contrast through this tube. A contrast is an injection that improves the quality of the pictures from your heart.  A gel will be applied to your chest.  A wand-like tool (transducer) will be moved over your chest. The gel will help to transmit the sound waves from the transducer.  The sound waves will harmlessly bounce off of your heart to  allow the heart images to be captured in real-time motion. The images will be recorded on a computer. The procedure may vary among health care providers and hospitals. What happens after the procedure?  You may return to your normal, everyday life, including diet, activities, and medicines, unless your health care provider tells you not to do that. Summary  An  echocardiogram is a procedure that uses painless sound waves (ultrasound) to produce an image of the heart.  Images from an echocardiogram can provide important information about the size and shape of your heart, heart muscle function, heart valve function, and fluid buildup around your heart.  You do not need to do anything to prepare before this procedure. You may eat and drink normally.  After the echocardiogram is completed, you may return to your normal, everyday life, unless your health care provider tells you not to do that. This information is not intended to replace advice given to you by your health care provider. Make sure you discuss any questions you have with your health care provider. Document Released: 03/11/2000 Document Revised: 04/16/2016 Document Reviewed: 04/16/2016 Elsevier Interactive Patient Education  2019 [employer].

## 2018-06-20 ENCOUNTER — Telehealth (HOSPITAL_COMMUNITY): Payer: Self-pay

## 2018-06-21 ENCOUNTER — Encounter: Payer: Self-pay | Admitting: Cardiology

## 2018-06-21 NOTE — Progress Notes (Signed)
Remote pacemaker transmission.   

## 2018-06-25 ENCOUNTER — Other Ambulatory Visit: Payer: Self-pay

## 2018-06-25 ENCOUNTER — Other Ambulatory Visit: Payer: Medicare Other | Admitting: *Deleted

## 2018-06-25 DIAGNOSIS — Z951 Presence of aortocoronary bypass graft: Secondary | ICD-10-CM

## 2018-06-25 DIAGNOSIS — I255 Ischemic cardiomyopathy: Secondary | ICD-10-CM

## 2018-06-25 LAB — BASIC METABOLIC PANEL
BUN/Creatinine Ratio: 20 (ref 10–24)
BUN: 27 mg/dL (ref 8–27)
CO2: 24 mmol/L (ref 20–29)
Calcium: 9.3 mg/dL (ref 8.6–10.2)
Chloride: 99 mmol/L (ref 96–106)
Creatinine, Ser: 1.34 mg/dL — ABNORMAL HIGH (ref 0.76–1.27)
GFR calc Af Amer: 60 mL/min/{1.73_m2} (ref >59)
GFR calc non Af Amer: 52 mL/min/{1.73_m2} — ABNORMAL LOW (ref >59)
Glucose: 102 mg/dL — ABNORMAL HIGH (ref 65–99)
Potassium: 4.2 mmol/L (ref 3.5–5.2)
Sodium: 143 mmol/L (ref 134–144)

## 2018-06-26 ENCOUNTER — Telehealth: Payer: Self-pay | Admitting: Internal Medicine

## 2018-06-26 NOTE — Telephone Encounter (Signed)
Pt calling to go over results of bloodwork

## 2018-06-27 NOTE — Telephone Encounter (Signed)
Returned pts call re: lab resulkts. Left a message for pt to call back.

## 2018-06-29 NOTE — Telephone Encounter (Signed)
Returned pts call and he has already spoken with Nada Boozer, NP, and has been made aware of his lab results. Pt thanked me for the follow-up to make sure his questions have been answered.

## 2018-07-06 ENCOUNTER — Other Ambulatory Visit: Payer: Self-pay

## 2018-07-09 ENCOUNTER — Ambulatory Visit: Payer: Medicare Other | Admitting: Thoracic Surgery (Cardiothoracic Vascular Surgery)

## 2018-07-09 ENCOUNTER — Other Ambulatory Visit: Payer: Self-pay | Admitting: Physician Assistant

## 2018-07-11 ENCOUNTER — Other Ambulatory Visit: Payer: Self-pay | Admitting: Internal Medicine

## 2018-07-11 MED ORDER — CARVEDILOL 6.25 MG PO TABS: 6.2500 mg | ORAL_TABLET | Freq: Two times a day (BID) | ORAL | 3 refills | Status: DC

## 2018-07-11 MED ORDER — ATORVASTATIN CALCIUM 80 MG PO TABS: 80.0000 mg | ORAL_TABLET | Freq: Every day | ORAL | 3 refills | Status: DC

## 2018-07-11 NOTE — Telephone Encounter (Signed)
Pt's medications was sent to pt's pharmacy as requested. Confirmation received.  

## 2018-08-02 DIAGNOSIS — Z006 Encounter for examination for normal comparison and control in clinical research program: Secondary | ICD-10-CM

## 2018-08-02 NOTE — Research (Signed)
[facility] Research Study Visit 10/EOS Telephone visit  Pt doing well at this time, no adverse events or changes in medications. I thanked the patient for his participation in the study as this completes his study visits.   Current Outpatient Medications:  .  acetaminophen (TYLENOL) 500 MG tablet, Take 500 mg by mouth every 6 (six) hours as needed., Disp: , Rfl:  .  Ascorbic Acid (VITAMIN C) 1000 MG tablet, Take 1,000 mg by mouth daily., Disp: , Rfl:  .  aspirin EC 81 MG tablet, Take 81 mg by mouth daily., Disp: , Rfl:  .  atorvastatin (LIPITOR) 80 MG tablet, Take 1 tablet (80 mg total) by mouth daily at 6 PM., Disp: 90 tablet, Rfl: 3 .  carvedilol (COREG) 6.25 MG tablet, Take 1 tablet (6.25 mg total) by mouth 2 (two) times daily., Disp: 180 tablet, Rfl: 3 .  cycloSPORINE (RESTASIS) 0.05 % ophthalmic emulsion, Place 1 drop into both eyes 2 (two) times daily., Disp: 0.4 mL, Rfl:  .  FERROUS SULFATE PO, Take 65 mg by mouth 2 (two) times a week., Disp: , Rfl:  .  furosemide (LASIX) 40 MG tablet, Take 1 tablet (40 mg total) by mouth 2 (two) times daily., Disp: , Rfl:  .  losartan (COZAAR) 25 MG tablet, Take 1 tablet (25 mg total) by mouth daily., Disp: 90 tablet, Rfl: 3 .  Multiple Vitamins-Minerals (EQ VISION FORMULA 50+) CAPS, Take 1 capsule by mouth daily., Disp: , Rfl:  .  Potassium 99 MG TABS, Take 1 tablet (99 mg total) by mouth daily., Disp: 330 each, Rfl: 0  [id number]  MOBILITY:    I HAVE NO PROBLEMS WALKING [x]   I HAVE SLIGHT PROBLEMS WALKING []   I HAVE MODERATE PROBLEMS WALKING []   I HAVE SEVERE PROBLEMS WALKING []   I AM UNABLE TO WALK  []     SELF-CARE:   I HAVE NO PROBLEMS WASING OR DRESSING MYSELF  [x]   I HAVE SLIGHT PROBLEMS WASHING OR DRESSING MYSELF  []   I HAVE MODERATE PROBLEMS WASHING OR DRESSING MYSELF []   I HAVE SEVERE PROBLEMS WASHING OR DRESSING MYSELF  []   I HAVE SEVERE PROBLEMS WASHING OR DRESSING MYSELF  []   I AM UNABLE TO WASH OR DRESS MYSELF []     USUAL  ACTIVITIES: (E.G. WORK/STUDY/HOUSEWORK/FAMILY OR LEISURE ACTIVITIES.    I HAVE NO PROBLEMS DOING MY USUAL ACTIVITIES [x]   I HAVE SLIGHT PROBLEMS DOING MY USUAL ACTIVITIES []   I HAVE MODERATE PROBLEMS DOING MY USUAL ACTIVIITIES []   I HAVE SEVERE PROBLEMS DOING MY USUAL ACTIVITIES []   I AM UNABLE TO DO MY USUAL ACTIVITIES []     PAIN /DISCOMFORT   I HAVE NO PAIN OR DISCOMFORT [x]   I HAVE SLIGHT PAIN OR DISCOMFORT []   I HAVE MODERATE PAIN OR DISCOMFORT []   I HAVE SEVERE PAIN OR DISCOMFORT []   I HAVE EXTREME PAIN OR DISCOMFORT []     ANXIETY/DEPRESSION   I AM NOT ANXIOUS OR DEPRESSED [x]   I AM SLIGHTLY ANXIOUS OR DEPRESSED []   I AM MODERATELY ANXIOUS OR DREPRESSED []   I AM SEVERELY ANXIOUS OR DEPRESSED []   I AM EXTREMELY ANXIOUS OR DEPRESSED []     SCALE OF 0-100 HOW WOULD YOU RATE TODAY?  0 IS THE WORSE AND 100 IS THE BEST HEALTH YOU CAN IMAGINE: 100
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
## 2018-08-07 ENCOUNTER — Telehealth: Payer: Self-pay

## 2018-08-07 NOTE — Telephone Encounter (Signed)
YOUR CARDIOLOGY TEAM HAS ARRANGED FOR AN E-VISIT FOR YOUR APPOINTMENT - PLEASE REVIEW IMPORTANT INFORMATION BELOW SEVERAL DAYS PRIOR TO YOUR APPOINTMENT  Due to the recent COVID-19 pandemic, we are transitioning in-person office visits to tele-medicine visits in an effort to decrease unnecessary exposure to our patients, their families, and staff. These visits are billed to your insurance just like a normal visit is. We also encourage you to sign up for MyChart if you have not already done so. You will need a smartphone if possible. For patients that do not have this, we can still complete the visit using a regular telephone but do prefer a smartphone to enable video when possible. You may have a family member that lives with you that can help. If possible, we also ask that you have a blood pressure cuff and scale at home to measure your blood pressure, heart rate and weight prior to your scheduled appointment. Patients with clinical needs that need an in-person evaluation and testing will still be able to come to the office if absolutely necessary. If you have any questions, feel free to call our office.     YOUR PROVIDER WILL BE USING THE FOLLOWING PLATFORM TO COMPLETE YOUR VISIT: Doximity  . IF USING MYCHART - How to Download the MyChart App to Your SmartPhone   - If Apple, go to App Store and type in MyChart in the search bar and download the app. If Android, ask patient to go to Google Play Store and type in MyChart in the search bar and download the app. The app is free but as with any other app downloads, your phone may require you to verify saved payment information or Apple/Android password.  - You will need to then log into the app with your MyChart username and password, and select [facility] as your healthcare provider to link the account.  - When it is time for your visit, go to the MyChart app, find appointments, and click Begin Video Visit. Be sure to Select Allow for your device to  access the Microphone and Camera for your visit. You will then be connected, and your provider will be with you shortly.  **If you have any issues connecting or need assistance, please contact MyChart service desk [phone] ([phone])**  **If using a computer, in order to ensure the best quality for your visit, you will need to use either of the following Internet Browsers: Google Chrome or Microsoft Edge**  . IF USING DOXIMITY or [email or web address].ME - The staff will give you instructions on receiving your link to join the meeting the day of your visit.      2-3 DAYS BEFORE YOUR APPOINTMENT  You will receive a telephone call from one of our Heart[facility] team members - your caller ID may say "Unknown caller." If this is a video visit, we will walk you through how to get the video launched on your phone. We will remind you check your blood pressure, heart rate and weight prior to your scheduled appointment. If you have an Apple Watch or Kardia, please upload any pertinent ECG strips the day before or morning of your appointment to MyChart. Our staff will also make sure you have reviewed the consent and agree to move forward with your scheduled tele-health visit.     THE DAY OF YOUR APPOINTMENT  Approximately 15 minutes prior to your scheduled appointment, you will receive a telephone call from one of HeartCare team - your caller ID may say "Unknown caller."    Our staff will confirm medications, vital signs for the day and any symptoms you may be experiencing. Please have this information available prior to the time of visit start. It may also be helpful for you to have a pad of paper and pen handy for any instructions given during your visit. They will also walk you through joining the smartphone meeting if this is a video visit.    CONSENT FOR TELE-HEALTH VISIT - PLEASE REVIEW  I hereby voluntarily request, consent and authorize [facility] and its employed or contracted physicians, physician  assistants, nurse practitioners or other licensed health care professionals (the Practitioner), to provide me with telemedicine health care services (the "Services") as deemed necessary by the treating Practitioner. I acknowledge and consent to receive the Services by the Practitioner via telemedicine. I understand that the telemedicine visit will involve communicating with the Practitioner through live audiovisual communication technology and the disclosure of certain medical information by electronic transmission. I acknowledge that I have been given the opportunity to request an in-person assessment or other available alternative prior to the telemedicine visit and am voluntarily participating in the telemedicine visit.  I understand that I have the right to withhold or withdraw my consent to the use of telemedicine in the course of my care at any time, without affecting my right to future care or treatment, and that the Practitioner or I may terminate the telemedicine visit at any time. I understand that I have the right to inspect all information obtained and/or recorded in the course of the telemedicine visit and may receive copies of available information for a reasonable fee.  I understand that some of the potential risks of receiving the Services via telemedicine include:  . Delay or interruption in medical evaluation due to technological equipment failure or disruption; . Information transmitted may not be sufficient (e.g. poor resolution of images) to allow for appropriate medical decision making by the Practitioner; and/or  . In rare instances, security protocols could fail, causing a breach of personal health information.  Furthermore, I acknowledge that it is my responsibility to provide information about my medical history, conditions and care that is complete and accurate to the best of my ability. I acknowledge that Practitioner's advice, recommendations, and/or decision may be based on  factors not within their control, such as incomplete or inaccurate data provided by me or distortions of diagnostic images or specimens that may result from electronic transmissions. I understand that the practice of medicine is not an exact science and that Practitioner makes no warranties or guarantees regarding treatment outcomes. I acknowledge that I will receive a copy of this consent concurrently upon execution via email to the email address I last provided but may also request a printed copy by calling the office of [facility].    I understand that my insurance will be billed for this visit.   I have read or had this consent read to me. . I understand the contents of this consent, which adequately explains the benefits and risks of the Services being provided via telemedicine.  . I have been provided ample opportunity to ask questions regarding this consent and the Services and have had my questions answered to my satisfaction. . I give my informed consent for the services to be provided through the use of telemedicine in my medical care  By participating in this telemedicine visit I agree to the above.  

## 2018-08-08 ENCOUNTER — Encounter: Payer: Self-pay | Admitting: Interventional Cardiology

## 2018-08-08 ENCOUNTER — Other Ambulatory Visit: Payer: Self-pay

## 2018-08-08 ENCOUNTER — Telehealth (INDEPENDENT_AMBULATORY_CARE_PROVIDER_SITE_OTHER): Payer: Medicare Other | Admitting: Interventional Cardiology

## 2018-08-08 VITALS — BP 130/88 | HR 74 | Temp 96.8°F | Ht 70.0 in | Wt 206.4 lb

## 2018-08-08 DIAGNOSIS — I442 Atrioventricular block, complete: Secondary | ICD-10-CM | POA: Diagnosis not present

## 2018-08-08 DIAGNOSIS — I5022 Chronic systolic (congestive) heart failure: Secondary | ICD-10-CM

## 2018-08-08 DIAGNOSIS — I110 Hypertensive heart disease with heart failure: Secondary | ICD-10-CM | POA: Diagnosis not present

## 2018-08-08 DIAGNOSIS — Z950 Presence of cardiac pacemaker: Secondary | ICD-10-CM

## 2018-08-08 DIAGNOSIS — E782 Mixed hyperlipidemia: Secondary | ICD-10-CM | POA: Diagnosis not present

## 2018-08-08 DIAGNOSIS — Z951 Presence of aortocoronary bypass graft: Secondary | ICD-10-CM

## 2018-08-08 DIAGNOSIS — I10 Essential (primary) hypertension: Secondary | ICD-10-CM

## 2018-08-08 DIAGNOSIS — Z7189 Other specified counseling: Secondary | ICD-10-CM

## 2018-08-08 DIAGNOSIS — I493 Ventricular premature depolarization: Secondary | ICD-10-CM

## 2018-08-08 NOTE — Patient Instructions (Signed)
Medication Instructions:  Your physician recommends that you continue on your current medications as directed. Please refer to the Current Medication list given to you today.  If you need a refill on your cardiac medications before your next appointment, please call your pharmacy.   Lab work: None If you have labs (blood work) drawn today and your tests are completely normal, you will receive your results only by: Marland Kitchen MyChart Message (if you have MyChart) OR . A paper copy in the mail If you have any lab test that is abnormal or we need to change your treatment, we will call you to review the results.  Testing/Procedures: None  Follow-Up: At [facility], you and your health needs are our priority.  As part of our continuing mission to provide you with exceptional heart care, we have created designated Provider Care Teams.  These Care Teams include your primary Cardiologist (physician) and Advanced Practice Providers (APPs -  Physician Assistants and Nurse Practitioners) who all work together to provide you with the care you need, when you need it. You will need a follow up appointment in 6 months.  Please call our office 2 months in advance to schedule this appointment.  You may see Dr. Katrinka Blazing or one of the following Advanced Practice Providers on your designated Care Team:   Norma Fredrickson, NP Nada Boozer, NP . Georgie Chard, NP  Any Other Special Instructions Will Be Listed Below (If Applicable).  Your provider recommends that you maintain 150 minutes per week of moderate aerobic activity.

## 2018-08-14 ENCOUNTER — Telehealth (HOSPITAL_COMMUNITY): Payer: Self-pay | Admitting: *Deleted

## 2018-08-14 NOTE — Telephone Encounter (Signed)
Notified patient of appointment time change for echocardiogram. Patient is not sure whether appointment with Graciela Husbands is virtual or not. I will have Glynda Jaeger call patient.

## 2018-08-15 ENCOUNTER — Telehealth: Payer: Self-pay | Admitting: Internal Medicine

## 2018-08-15 NOTE — Telephone Encounter (Signed)
Pt advised her will have [facility] on 08/21/18 at 12:30 and virtual visit with Dr. Graciela Husbands at 2pm the same day.. [place] the 2pm call. Spoke with Erie Noe in [place] and she says at least preliminary will be avaiable for the 2pm call with Dr. Graciela Husbands. Will note in appt notes.      Virtual Visit Pre-Appointment Phone Call  "(Name), I am calling you today to discuss your upcoming appointment. We are currently trying to limit exposure to the virus that causes COVID-19 by seeing patients at home rather than in the office."  1. "What is the BEST phone number to call the day of the visit?" - include this in appointment notes  2. "Do you have or have access to (through a family member/friend) a smartphone with video capability that we can use for your visit?" a. If yes - list this number in appt notes as "cell" (if different from BEST phone #) and list the appointment type as a VIDEO visit in appointment notes b. If no - list the appointment type as a PHONE visit in appointment notes  3. Confirm consent - "In the setting of the current Covid19 crisis, you are scheduled for a (phone or video) visit with your provider on (date) at (time).  Just as we do with many in-office visits, in order for you to participate in this visit, we must obtain consent.  If you'd like, I can send this to your mychart (if signed up) or email for you to review.  Otherwise, I can obtain your verbal consent now.  All virtual visits are billed to your insurance company just like a normal visit would be.  By agreeing to a virtual visit, we'd like you to understand that the technology does not allow for your provider to perform an examination, and thus may limit your provider's ability to fully assess your condition. If your provider identifies any concerns that need to be evaluated in person, we will make arrangements to do so.  Finally, though the technology is pretty good, we cannot assure that it  will always work on either your or our end, and in the setting of a video visit, we may have to convert it to a phone-only visit.  In either situation, we cannot ensure that we have a secure connection.  Are you willing to proceed?" STAFF: Did the patient verbally acknowledge consent to telehealth visit? Document YES/NO here: YES  4. Advise patient to be prepared - "Two hours prior to your appointment, go ahead and check your blood pressure, pulse, oxygen saturation, and your weight (if you have the equipment to check those) and write them all down. When your visit starts, your provider will ask you for this information. If you have an Apple Watch or Kardia device, please plan to have heart rate information ready on the day of your appointment. Please have a pen and paper handy nearby the day of the visit as well."  5. Give patient instructions for MyChart download to smartphone OR Doximity/Doxy.me as below if video visit (depending on what platform provider is using)  6. Inform patient they will receive a phone call 15 minutes prior to their appointment time (may be from unknown caller ID) so they should be prepared to answer    TELEPHONE CALL NOTE  TRISDEN PIPPIN has been deemed a candidate for a follow-up tele-health visit to limit community exposure during the Covid-19 pandemic. I spoke  with the patient via phone to ensure availability of phone/video source, confirm preferred email & phone number, and discuss instructions and expectations.  I reminded Brian [name] to be prepared with any vital sign and/or heart rhythm information that could potentially be obtained via home monitoring, at the time of his visit. I reminded Brian [name] to expect a phone call prior to his visit.  Brian MillardAnn M Jevonte Clanton, RN 08/15/2018 1:43 PM   INSTRUCTIONS FOR DOWNLOADING THE MYCHART APP TO SMARTPHONE  - The patient must first make sure to have activated MyChart and know their login information - If Apple, go to [employer]  Store and type in MyChart in the search bar and download the app. If Android, ask patient to go to [employer] Play Store and type in [place] in the search bar and download the app. The app is free but as with any other app downloads, their phone may require them to verify saved payment information or Apple/Android password.  - The patient will need to then log into the app with their MyChart username and password, and select [facility] as their healthcare provider to link the account. When it is time for your visit, go to the MyChart app, find appointments, and click Begin Video Visit. Be sure to Select Allow for your device to access the Microphone and Camera for your visit. You will then be connected, and your provider will be with you shortly.  **If they have any issues connecting, or need assistance please contact MyChart service desk [phone])**  **If using a computer, in order to ensure the best quality for their visit they will need to use either of the following Internet Browsers: [employer] Edge, or Google Chrome**  IF USING DOXIMITY or DOXY.ME - The patient will receive a link just prior to their visit by text.     FULL LENGTH CONSENT FOR TELE-HEALTH VISIT   I hereby voluntarily request, consent and authorize [facility] and its employed or contracted physicians, physician assistants, nurse practitioners or other licensed health care professionals (the Practitioner), to provide me with telemedicine health care services (the "Services") as deemed necessary by the treating Practitioner. I acknowledge and consent to receive the Services by the Practitioner via telemedicine. I understand that the telemedicine visit will involve communicating with the Practitioner through live audiovisual communication technology and the disclosure of certain medical information by electronic transmission. I acknowledge that I have been given the opportunity to request an in-person assessment or  other available alternative prior to the telemedicine visit and am voluntarily participating in the telemedicine visit.  I understand that I have the right to withhold or withdraw my consent to the use of telemedicine in the course of my care at any time, without affecting my right to future care or treatment, and that the Practitioner or I may terminate the telemedicine visit at any time. I understand that I have the right to inspect all information obtained and/or recorded in the course of the telemedicine visit and may receive copies of available information for a reasonable fee.  I understand that some of the potential risks of receiving the Services via telemedicine include:  Marland Kitchen. Delay or interruption in medical evaluation due to technological equipment failure or disruption; . Information transmitted may not be sufficient (e.g. poor resolution of images) to allow for appropriate medical decision making by the Practitioner; and/or  . In rare instances, security protocols could fail, causing a breach of personal health information.  Furthermore, I acknowledge  that it is my responsibility to provide information about my medical history, conditions and care that is complete and accurate to the best of my ability. I acknowledge that Practitioner's advice, recommendations, and/or decision may be based on factors not within their control, such as incomplete or inaccurate data provided by me or distortions of diagnostic images or specimens that may result from electronic transmissions. I understand that the practice of medicine is not an exact science and that Practitioner makes no warranties or guarantees regarding treatment outcomes. I acknowledge that I will receive a copy of this consent concurrently upon execution via email to the email address I last provided but may also request a printed copy by calling the office of [facility].    I understand that my insurance will be billed for this visit.   I  have read or had this consent read to me. . I understand the contents of this consent, which adequately explains the benefits and risks of the Services being provided via telemedicine.  . I have been provided ample opportunity to ask questions regarding this consent and the Services and have had my questions answered to my satisfaction. . I give my informed consent for the services to be provided through the use of telemedicine in my medical care  By participating in this telemedicine visit I agree to the above.

## 2018-08-15 NOTE — Telephone Encounter (Signed)
New Message            Patient is calling to see why he has to come in a do a test, then leave to go home to  talk on the phone to the doctor. Pls call to advise

## 2018-08-17 ENCOUNTER — Telehealth (HOSPITAL_COMMUNITY): Payer: Self-pay | Admitting: Radiology

## 2018-08-17 NOTE — Telephone Encounter (Signed)
Spoke with wife. Gave instructions for echocardiogram and office. Unable to perform COVID prescreening.

## 2018-08-21 ENCOUNTER — Other Ambulatory Visit: Payer: Self-pay

## 2018-08-21 ENCOUNTER — Encounter: Payer: Self-pay | Admitting: Internal Medicine

## 2018-08-21 ENCOUNTER — Encounter: Payer: Medicare Other | Admitting: Internal Medicine

## 2018-08-21 ENCOUNTER — Telehealth (INDEPENDENT_AMBULATORY_CARE_PROVIDER_SITE_OTHER): Payer: Medicare Other | Admitting: Internal Medicine

## 2018-08-21 ENCOUNTER — Ambulatory Visit (HOSPITAL_COMMUNITY): Payer: Medicare Other | Attending: Cardiology

## 2018-08-21 VITALS — BP 118/82 | HR 66 | Temp 97.8°F | Ht 70.0 in | Wt 206.4 lb

## 2018-08-21 DIAGNOSIS — I429 Cardiomyopathy, unspecified: Secondary | ICD-10-CM | POA: Diagnosis not present

## 2018-08-21 DIAGNOSIS — I5022 Chronic systolic (congestive) heart failure: Secondary | ICD-10-CM

## 2018-08-21 DIAGNOSIS — N183 Chronic kidney disease, stage 3 (moderate): Secondary | ICD-10-CM | POA: Diagnosis not present

## 2018-08-21 DIAGNOSIS — I255 Ischemic cardiomyopathy: Secondary | ICD-10-CM | POA: Insufficient documentation

## 2018-08-21 DIAGNOSIS — I442 Atrioventricular block, complete: Secondary | ICD-10-CM

## 2018-08-21 DIAGNOSIS — Z950 Presence of cardiac pacemaker: Secondary | ICD-10-CM

## 2018-08-21 DIAGNOSIS — Z951 Presence of aortocoronary bypass graft: Secondary | ICD-10-CM | POA: Diagnosis not present

## 2018-08-21 DIAGNOSIS — I129 Hypertensive chronic kidney disease with stage 1 through stage 4 chronic kidney disease, or unspecified chronic kidney disease: Secondary | ICD-10-CM

## 2018-08-21 DIAGNOSIS — I2510 Atherosclerotic heart disease of native coronary artery without angina pectoris: Secondary | ICD-10-CM

## 2018-08-21 MED ORDER — PERFLUTREN LIPID MICROSPHERE
1.0000 mL | INTRAVENOUS | Status: AC | PRN
  Administered 2018-08-21: 2 mL via INTRAVENOUS
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
## 2018-08-22 ENCOUNTER — Telehealth: Payer: Self-pay | Admitting: *Deleted

## 2018-08-22 DIAGNOSIS — I5023 Acute on chronic systolic (congestive) heart failure: Secondary | ICD-10-CM

## 2018-08-22 MED ORDER — SACUBITRIL-VALSARTAN 24-26 MG PO TABS: 1.0000 | ORAL_TABLET | Freq: Two times a day (BID) | ORAL | 11 refills | Status: DC

## 2018-08-22 NOTE — Telephone Encounter (Signed)
-----   Message from Lyn Records, MD sent at 08/21/2018  9:07 PM EDT ----- Stop Losartan, start Entresto 24/26 mg BID. Up-titrate HF therapy to eventual mid-dose of Entresto. Further beta blocker titration after getting to moderated dose Entresto.

## 2018-08-22 NOTE — Telephone Encounter (Signed)
Spoke with pt and went over results and recommendations.  Pt is in [employer] and will come by the office to get samples and discount card.  Pt will come for labs 6/5.  Advised pt to monitor BP and reviewed parameters on when to call.  Pt verbalized understanding and thanked me for the call.

## 2018-08-31 ENCOUNTER — Other Ambulatory Visit: Payer: Self-pay

## 2018-08-31 ENCOUNTER — Other Ambulatory Visit: Payer: Medicare Other | Admitting: *Deleted

## 2018-08-31 ENCOUNTER — Other Ambulatory Visit: Payer: Self-pay | Admitting: Interventional Cardiology

## 2018-08-31 ENCOUNTER — Other Ambulatory Visit: Payer: Medicare Other

## 2018-08-31 DIAGNOSIS — I5023 Acute on chronic systolic (congestive) heart failure: Secondary | ICD-10-CM | POA: Diagnosis not present

## 2018-08-31 LAB — BASIC METABOLIC PANEL
BUN/Creatinine Ratio: 17 (ref 10–24)
BUN: 22 mg/dL (ref 8–27)
CO2: 24 mmol/L (ref 20–29)
Calcium: 9.1 mg/dL (ref 8.6–10.2)
Chloride: 104 mmol/L (ref 96–106)
Creatinine, Ser: 1.29 mg/dL — ABNORMAL HIGH (ref 0.76–1.27)
GFR calc Af Amer: 63 mL/min/{1.73_m2} (ref >59)
GFR calc non Af Amer: 55 mL/min/{1.73_m2} — ABNORMAL LOW (ref >59)
Glucose: 108 mg/dL — ABNORMAL HIGH (ref 65–99)
Potassium: 4.1 mmol/L (ref 3.5–5.2)
Sodium: 142 mmol/L (ref 134–144)

## 2018-09-01 DIAGNOSIS — H04123 Dry eye syndrome of bilateral lacrimal glands: Secondary | ICD-10-CM | POA: Diagnosis not present

## 2018-09-01 DIAGNOSIS — H40033 Anatomical narrow angle, bilateral: Secondary | ICD-10-CM | POA: Diagnosis not present

## 2018-09-10 ENCOUNTER — Ambulatory Visit: Payer: Medicare Other | Admitting: Thoracic Surgery (Cardiothoracic Vascular Surgery)

## 2018-09-12 ENCOUNTER — Ambulatory Visit (INDEPENDENT_AMBULATORY_CARE_PROVIDER_SITE_OTHER): Payer: Medicare Other | Admitting: *Deleted

## 2018-09-12 DIAGNOSIS — I442 Atrioventricular block, complete: Secondary | ICD-10-CM

## 2018-09-12 LAB — CUP PACEART REMOTE DEVICE CHECK
Battery Remaining Longevity: 115 mo
Battery Voltage: 3 V
Brady Statistic AP VP Percent: 7.24 %
Brady Statistic AP VS Percent: 0.06 %
Brady Statistic AS VP Percent: 83.43 %
Brady Statistic AS VS Percent: 9.27 %
Brady Statistic RA Percent Paced: 9.73 %
Brady Statistic RV Percent Paced: 90.67 %
Date Time Interrogation Session: 20200617051333
Implantable Lead Implant Date: 20190614
Implantable Lead Implant Date: 20190614
Implantable Lead Implant Date: 20200213
Implantable Lead Implant Date: 20200213
Implantable Lead Location: 753858
Implantable Lead Location: 753858
Implantable Lead Location: 753859
Implantable Lead Location: 753860
Implantable Lead Model: 3830
Implantable Lead Model: 5071
Implantable Lead Model: 5071
Implantable Lead Model: 5076
Implantable Pulse Generator Implant Date: 20190614
Lead Channel Impedance Value: 304 Ohm
Lead Channel Impedance Value: 323 Ohm
Lead Channel Impedance Value: 456 Ohm
Lead Channel Impedance Value: 456 Ohm
Lead Channel Pacing Threshold Amplitude: 0.875 V
Lead Channel Pacing Threshold Amplitude: 1.125 V
Lead Channel Pacing Threshold Pulse Width: 0.4 ms
Lead Channel Pacing Threshold Pulse Width: 0.4 ms
Lead Channel Sensing Intrinsic Amplitude: 4.5 mV
Lead Channel Sensing Intrinsic Amplitude: 4.5 mV
Lead Channel Sensing Intrinsic Amplitude: 5.75 mV
Lead Channel Sensing Intrinsic Amplitude: 5.75 mV
Lead Channel Setting Pacing Amplitude: 1.75 V
Lead Channel Setting Pacing Amplitude: 2.5 V
Lead Channel Setting Pacing Pulse Width: 0.6 ms
Lead Channel Setting Sensing Sensitivity: 2 mV

## 2018-09-20 ENCOUNTER — Telehealth: Payer: Self-pay | Admitting: Internal Medicine

## 2018-09-20 NOTE — Telephone Encounter (Signed)
I let the pt wife know it was ok it he used the scale as long as it is a foot away from his device. She was very Patent attorney. She states that everyone in the device clinic do a wonderful job in taking care of his needs.

## 2018-09-20 NOTE — Telephone Encounter (Signed)
New message   Patient wants to know if he can use electronic scales if he has a device. Please call.

## 2018-09-23 ENCOUNTER — Encounter: Payer: Self-pay | Admitting: Cardiology

## 2018-09-23 NOTE — Progress Notes (Signed)
Remote pacemaker transmission.   

## 2018-09-24 ENCOUNTER — Other Ambulatory Visit: Payer: Self-pay

## 2018-09-24 ENCOUNTER — Encounter: Payer: Self-pay | Admitting: Thoracic Surgery (Cardiothoracic Vascular Surgery)

## 2018-09-24 ENCOUNTER — Ambulatory Visit: Payer: Medicare Other | Admitting: Thoracic Surgery (Cardiothoracic Vascular Surgery)

## 2018-09-24 VITALS — BP 124/79 | HR 66 | Temp 97.9°F | Resp 20 | Ht 70.0 in | Wt 209.0 lb

## 2018-09-24 DIAGNOSIS — I2510 Atherosclerotic heart disease of native coronary artery without angina pectoris: Secondary | ICD-10-CM

## 2018-09-24 DIAGNOSIS — Z951 Presence of aortocoronary bypass graft: Secondary | ICD-10-CM

## 2018-09-24 NOTE — Patient Instructions (Signed)
Continue all previous medications without any changes at this time  You may resume unrestricted physical activity without any particular limitations at this time.  Check your weight on a regular basis and keep a log for your records.  Look for signs of fluid overload such as worsening swelling of your lower legs, increased shortness of breath with activity, and/or a dry nonproductive cough.  Discussed these findings with your cardiologist including whether or not you should adjust your fluid pill dosage (diuretic).

## 2018-09-24 NOTE — Progress Notes (Signed)
[address]       [place] [id number]             [phone]     CARDIOTHORACIC SURGERY OFFICE NOTE  Referring Provider is Marinus Mawaylor, Gregg W, MD Primary Cardiologist is Lesleigh NoeHenry W Smith III, MD PCP is Merlene LaughterStoneking, Hal, MD   HPI:  Patient is a 73 year old male with no previous history of coronary artery disease but risk factors notable for history of complete heart block status post permanent pacemaker placement in June 2019 and hypertension.  The patient presented with a 2951-month history of progressive symptoms of exertional shortness of breath, orthopnea, and lower extremity edema consistent with acute exacerbation of chronic combined systolic and diastolic congestive heart failure.  He underwent coronary artery bypass grafting x3 on May 10, 2018 for severe three-vessel coronary artery disease with severe left ventricular systolic dysfunction with preoperative ejection fraction estimated 25%.  Epicardial left ventricular pacemaker leads were placed at the time of surgery because of the presence of severe left ventricular systolic dysfunction with bundle branch block on baseline EKG.  The patient's early postoperative recovery was uneventful and he was discharged home on the seventh postoperative day.  He was last seen here in our office on June 04, 2018 at which time he was recovering well.  Since then he has been followed carefully by Dr. Katrinka BlazingSmith and he has been seen in follow-up by Dr. Graciela HusbandsKlein with EP.  Follow-up echocardiogram performed Aug 21, 2018 and the patient returns to our office today for routine follow-up.  Patient states that he is back at work at [employer] [place] where he serves as the [employer].  He states that he feels better than he has felt in more than 15 years.  He has lost 25 pounds in weight.  He walks every day and reports that he can walk much further than he has been able to in a long time without getting short of breath.  He specifically denies  exertional shortness of breath or chest discomfort.  He is delighted with his progress.  He has no significant residual surgical pain related to his incision.   Current Outpatient Medications  Medication Sig Dispense Refill  . acetaminophen (TYLENOL) 500 MG tablet Take 500 mg by mouth every 6 (six) hours as needed.    . Ascorbic Acid (VITAMIN C) 1000 MG tablet Take 1,000 mg by mouth daily.    Marland Kitchen. aspirin EC 81 MG tablet Take 81 mg by mouth daily.    Marland Kitchen. atorvastatin (LIPITOR) 80 MG tablet Take 1 tablet (80 mg total) by mouth daily at 6 PM. 90 tablet 3  . carvedilol (COREG) 6.25 MG tablet Take 1 tablet (6.25 mg total) by mouth 2 (two) times daily. 180 tablet 3  . cycloSPORINE (RESTASIS) 0.05 % ophthalmic emulsion Place 1 drop into both eyes 2 (two) times daily. 0.4 mL   . FERROUS SULFATE PO Take 65 mg by mouth 2 (two) times a week.    . furosemide (LASIX) 40 MG tablet Take 1 tablet (40 mg total) by mouth 2 (two) times daily.    . Multiple Vitamins-Minerals (EQ VISION FORMULA 50+) CAPS Take 1 capsule by mouth daily.    . Potassium 99 MG TABS Take 1 tablet (99 mg total) by mouth daily. 330 each 0  . sacubitril-valsartan (ENTRESTO) 24-26 MG Take 1 tablet by mouth 2 (two) times daily. 60 tablet 11   No current facility-administered medications for this visit.  Physical Exam:   BP 124/79   Pulse 66   Temp 97.9 F (36.6 C) (Temporal)   Resp 20   Ht 5\' 10"  (1.778 m)   Wt 209 lb (94.8 kg)   SpO2 97% Comment: RA  BMI 29.99 kg/m   General:  Well-appearing  Chest:   Clear to auscultation  CV:   Regular rate and rhythm without murmur  Incisions:  Completely healed, sternum is stable  Abdomen:  Soft nontender  Extremities:  Warm and well-perfused   Diagnostic Tests:    ECHOCARDIOGRAM REPORT         Patient Name:   Neoma LamingOMMY H Fahrney Date of Exam: 08/21/2018 Medical Rec #:  [id number]      Height:       70.0 in Accession #:    [id number]     Weight:       206.4 lb Date of Birth:   22-Jun-1945      BSA:          2.12 m Patient Age:    73 years       BP:           118/80 mmHg Patient Gender: M              HR:           79 bpm. Exam Location:  [address]     Procedure: 2D Echo, Cardiac Doppler, Color Doppler and Intracardiac            Opacification Agent   Indications:    I42.9 Cardiomyopathy   History:        Patient has prior history of Echocardiogram examinations, most                 recent 04/19/2018. CAD Prior CABG and Pacemaker Risk Factors:                 Hypertension and HLD.   Sonographer:    Clearence Pedammie Crouch RCS Referring Phys: [id number] LAURA R INGOLD   IMPRESSIONS      1. The left ventricle has moderate-severely reduced systolic function, with an ejection fraction of 30-35%. The cavity size was normal. Left ventricular diastolic Doppler parameters are consistent with impaired relaxation. Elevated mean left atrial  pressure Left ventrical global hypokinesis without regional wall motion abnormalities.  2. The right ventricle has normal systolic function. The cavity was normal. There is no increase in right ventricular wall thickness.  3. Mild thickening of the mitral valve leaflet. Mild calcification of the mitral valve leaflet. There is moderate mitral annular calcification present.  4. The aortic valve is tricuspid. Moderate thickening of the aortic valve. Moderate calcification of the aortic valve. Aortic valve regurgitation was not assessed by color flow Doppler.  5. Aortic valve sclerosis without stenosis.   FINDINGS  Left Ventricle: The left ventricle has moderate-severely reduced systolic function, with an ejection fraction of 30-35%. The cavity size was normal. There is no increase in left ventricular wall thickness. Left ventricular diastolic Doppler parameters  are consistent with impaired relaxation. Elevated mean left atrial pressure Left ventrical global hypokinesis without regional wall motion abnormalities. Definity contrast agent was given IV  to delineate the left ventricular endocardial borders.   Right Ventricle: The right ventricle has normal systolic function. The cavity was normal. There is no increase in right ventricular wall thickness.   Left Atrium: Left atrial size was normal in size.   Right Atrium: Right atrial size was normal in size.  Right atrial pressure is estimated at 3 mmHg.   Interatrial Septum: No atrial level shunt detected by color flow Doppler.   Pericardium: There is no evidence of pericardial effusion.   Mitral Valve: The mitral valve is normal in structure. Mild thickening of the mitral valve leaflet. Mild calcification of the mitral valve leaflet. There is moderate mitral annular calcification present. Mitral valve regurgitation was not assessed by  color flow Doppler.   Tricuspid Valve: The tricuspid valve is normal in structure. Tricuspid valve regurgitation is trivial by color flow Doppler.   Aortic Valve: The aortic valve is tricuspid Moderate thickening of the aortic valve. Moderate calcification of the aortic valve. Aortic valve regurgitation was not assessed by color flow Doppler. Aortic valve sclerosis without stenosis.   Pulmonic Valve: The pulmonic valve was normal in structure. Pulmonic valve regurgitation is mild by color flow Doppler.   Venous: The inferior vena cava is normal in size with greater than 50% respiratory variability.     +--------------+--------++ LEFT VENTRICLE         +----------------+---------++ +--------------+--------++ Diastology                PLAX 2D                +----------------+---------++ +--------------+--------++ LV e' lateral:  3.70 cm/s LVIDd:        4.70 cm  +----------------+---------++ +--------------+--------++ LV E/e' lateral:22.2      LVIDs:        4.05 cm  +----------------+---------++ +--------------+--------++ LV e' medial:   4.03 cm/s LV PW:        1.21 cm  +----------------+---------++  +--------------+--------++ LV E/e' medial: 20.3      LV IVS:       0.54 cm  +----------------+---------++ +--------------+--------++ LVOT diam:    2.10 cm  +--------------+--------++ LV SV:        30 ml    +--------------+--------++ LV SV Index:  13.93    +--------------+--------++ LVOT Area:    3.46 cm +--------------+--------++                        +--------------+--------++   +---------------+---------++ RIGHT VENTRICLE          +---------------+---------++ RV Basal diam: 3.35 cm   +---------------+---------++ RV S prime:    9.03 cm/s +---------------+---------++ TAPSE (M-mode):2.3 cm    +---------------+---------++ RVSP:          23.6 mmHg +---------------+---------++   +---------------+-------++-----------++ LEFT ATRIUM           Index       +---------------+-------++-----------++ LA diam:       3.40 cm1.61 cm/m  +---------------+-------++-----------++ LA Vol (A2C):  53.5 ml25.29 ml/m +---------------+-------++-----------++ LA Vol (A4C):  63.2 ml29.88 ml/m +---------------+-------++-----------++ LA Biplane Vol:59.4 ml28.08 ml/m +---------------+-------++-----------++ +------------+---------++-----------++ RIGHT ATRIUM         Index       +------------+---------++-----------++ RA Pressure:3.00 mmHg            +------------+---------++-----------++ RA Area:    12.20 cm            +------------+---------++-----------++ RA Volume:  28.20 ml 13.33 ml/m +------------+---------++-----------++  +---------------+-----------++ AORTIC VALVE               +---------------+-----------++ AV Area (Vmax):3.36 cm    +---------------+-----------++ AV Vmax:       84.00 cm/s  +---------------+-----------++ AV Peak Grad:  2.8 mmHg    +---------------+-----------++ AV Mean Grad:  6.4 mmHg    +---------------+-----------++ LVOT Vmax:  81.50  cm/s  +---------------+-----------++ LVOT Vmean:    57.200 cm/s +---------------+-----------++ LVOT VTI:      0.170 m     +---------------+-----------++   +-------------+-------++ AORTA                +-------------+-------++ Ao Root diam:3.80 cm +-------------+-------++   +--------------+--------++    +---------------+-----------++ MITRAL VALVE              TRICUSPID VALVE            +--------------+--------++    +---------------+-----------++ MV Area (PHT):            TR Peak grad:  20.6 mmHg   +--------------+--------++    +---------------+-----------++ MV PHT:                   TR Vmax:       227.00 cm/s +--------------+--------++    +---------------+-----------++ MV Decel Time:158 msec    Estimated RAP: 3.00 mmHg   +--------------+--------++    +---------------+-----------++ +--------------+-----------++ RVSP:          23.6 mmHg   MV E velocity:82.00 cm/s  +---------------+-----------++ +--------------+-----------++ MV A velocity:125.00 cm/s +--------------+-------+ +--------------+-----------++ SHUNTS                MV E/A ratio: 0.66        +--------------+-------+ +--------------+-----------++ Systemic VTI: 0.17 m                                +--------------+-------+                               Systemic Diam:2.10 cm                               +--------------+-------+     Skeet Latch MD Electronically signed by Skeet Latch MD Signature Date/Time: 08/21/2018/3:24:37 PM      Impression:  Patient is doing very well 4-1/2 months status post coronary artery bypass grafting.  Follow-up transthoracic echocardiogram reveals some improvement in left ventricular systolic function, although ejection fraction remains depressed at 35%.  For unclear reasons the severity of mitral regurgitation was not evaluated using color Doppler on most recent echocardiogram.  Plan:  We have not  recommended any change the patient's current medications.  I have encouraged the patient to continue to increase his physical activity without any particular limitations.  He will continue to follow-up with Dr. Tamala Julian and Dr. Caryl Comes with regards to whether or not he might benefit from upgrade of his current pacemaker to a defibrillator and/or biventricular pacer.  All questions answered.  I spent in excess of 15 minutes during the conduct of this office consultation and >50% of this time involved direct face-to-face encounter with the patient for counseling and/or coordination of their care.    Valentina Gu. Roxy Manns, MD 09/24/2018 9:30 AM

## 2018-10-02 ENCOUNTER — Telehealth: Payer: Self-pay | Admitting: Internal Medicine

## 2018-10-02 NOTE — Telephone Encounter (Signed)
Left the pt a message to call the office back for further assistance. 

## 2018-10-02 NOTE — Telephone Encounter (Signed)
New Message             Patient is needing clarification on "Losartin", patient states he thinks he was told not to take Losartin, Pls advise. Patient states he called on yesterday, unfortunately I didn't see a message. Pls help

## 2018-10-02 NOTE — Telephone Encounter (Signed)
Call returned from Pt.  Verified all patient medications.  Pt current medication list is correct and Pt confirmed with this nurse.
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
## 2018-10-03 ENCOUNTER — Ambulatory Visit: Payer: Medicare Other | Admitting: Interventional Cardiology

## 2018-10-03 ENCOUNTER — Telehealth: Payer: Self-pay | Admitting: Interventional Cardiology

## 2018-10-03 ENCOUNTER — Encounter: Payer: Self-pay | Admitting: Interventional Cardiology

## 2018-10-03 ENCOUNTER — Other Ambulatory Visit: Payer: Self-pay

## 2018-10-03 VITALS — BP 126/80 | HR 81 | Ht 70.0 in | Wt 210.0 lb

## 2018-10-03 DIAGNOSIS — I5022 Chronic systolic (congestive) heart failure: Secondary | ICD-10-CM

## 2018-10-03 DIAGNOSIS — I10 Essential (primary) hypertension: Secondary | ICD-10-CM

## 2018-10-03 DIAGNOSIS — Z950 Presence of cardiac pacemaker: Secondary | ICD-10-CM

## 2018-10-03 DIAGNOSIS — Z951 Presence of aortocoronary bypass graft: Secondary | ICD-10-CM | POA: Diagnosis not present

## 2018-10-03 DIAGNOSIS — E782 Mixed hyperlipidemia: Secondary | ICD-10-CM

## 2018-10-03 DIAGNOSIS — I442 Atrioventricular block, complete: Secondary | ICD-10-CM | POA: Diagnosis not present

## 2018-10-03 MED ORDER — SACUBITRIL-VALSARTAN 49-51 MG PO TABS: 1.0000 | ORAL_TABLET | Freq: Two times a day (BID) | ORAL | 11 refills | Status: DC

## 2018-10-03 NOTE — Patient Instructions (Signed)
Medication Instructions:  1) When you finish your current supply of Entresto, INCREASE Entresto to 49/51mg  twice daily.   If you need a refill on your cardiac medications before your next appointment, please call your pharmacy.   Lab work: Your physician recommends that you return for lab work in: 1-2 weeks after dose change.   If you have labs (blood work) drawn today and your tests are completely normal, you will receive your results only by: Marland Kitchen MyChart Message (if you have MyChart) OR . A paper copy in the mail If you have any lab test that is abnormal or we need to change your treatment, we will call you to review the results.  Testing/Procedures: None  Follow-Up: At [facility], you and your health needs are our priority.  As part of our continuing mission to provide you with exceptional heart care, we have created designated Provider Care Teams.  These Care Teams include your primary Cardiologist (physician) and Advanced Practice Providers (APPs -  Physician Assistants and Nurse Practitioners) who all work together to provide you with the care you need, when you need it. You will need a follow up appointment in 6-8 weeks.  Please call our office 2 months in advance to schedule this appointment.  You may see Sinclair Grooms, MD or one of the following Advanced Practice Providers on your designated Care Team:   Truitt Merle, NP Cecilie Kicks, NP . Kathyrn Drown, NP  Any Other Special Instructions Will Be Listed Below (If Applicable).

## 2018-10-03 NOTE — Telephone Encounter (Signed)

## 2018-11-08 ENCOUNTER — Other Ambulatory Visit: Payer: Self-pay

## 2018-11-08 ENCOUNTER — Other Ambulatory Visit: Payer: Medicare Other | Admitting: *Deleted

## 2018-11-08 DIAGNOSIS — Z951 Presence of aortocoronary bypass graft: Secondary | ICD-10-CM

## 2018-11-08 DIAGNOSIS — E782 Mixed hyperlipidemia: Secondary | ICD-10-CM | POA: Diagnosis not present

## 2018-11-08 DIAGNOSIS — I5022 Chronic systolic (congestive) heart failure: Secondary | ICD-10-CM | POA: Diagnosis not present

## 2018-11-08 DIAGNOSIS — I10 Essential (primary) hypertension: Secondary | ICD-10-CM | POA: Diagnosis not present

## 2018-11-08 LAB — HEPATIC FUNCTION PANEL
ALT: 30 IU/L (ref 0–44)
AST: 26 IU/L (ref 0–40)
Albumin: 4.3 g/dL (ref 3.7–4.7)
Alkaline Phosphatase: 64 IU/L (ref 39–117)
Bilirubin Total: 0.9 mg/dL (ref 0.0–1.2)
Bilirubin, Direct: 0.22 mg/dL (ref 0.00–0.40)
Total Protein: 6.8 g/dL (ref 6.0–8.5)

## 2018-11-08 LAB — LIPID PANEL
Chol/HDL Ratio: 3 ratio (ref 0.0–5.0)
Cholesterol, Total: 96 mg/dL — ABNORMAL LOW (ref 100–199)
HDL: 32 mg/dL — ABNORMAL LOW (ref >39)
LDL Calculated: 34 mg/dL (ref 0–99)
Triglycerides: 148 mg/dL (ref 0–149)
VLDL Cholesterol Cal: 30 mg/dL (ref 5–40)

## 2018-11-08 LAB — BASIC METABOLIC PANEL
BUN/Creatinine Ratio: 18 (ref 10–24)
BUN: 21 mg/dL (ref 8–27)
CO2: 23 mmol/L (ref 20–29)
Calcium: 8.9 mg/dL (ref 8.6–10.2)
Chloride: 100 mmol/L (ref 96–106)
Creatinine, Ser: 1.17 mg/dL (ref 0.76–1.27)
GFR calc Af Amer: 71 mL/min/{1.73_m2} (ref >59)
GFR calc non Af Amer: 61 mL/min/{1.73_m2} (ref >59)
Glucose: 95 mg/dL (ref 65–99)
Potassium: 4.5 mmol/L (ref 3.5–5.2)
Sodium: 140 mmol/L (ref 134–144)
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
## 2018-11-21 ENCOUNTER — Ambulatory Visit (INDEPENDENT_AMBULATORY_CARE_PROVIDER_SITE_OTHER): Payer: Medicare Other | Admitting: Interventional Cardiology

## 2018-11-21 ENCOUNTER — Encounter: Payer: Self-pay | Admitting: Interventional Cardiology

## 2018-11-21 ENCOUNTER — Other Ambulatory Visit: Payer: Self-pay

## 2018-11-21 VITALS — BP 142/84 | HR 79 | Ht 70.0 in | Wt 208.0 lb

## 2018-11-21 DIAGNOSIS — I442 Atrioventricular block, complete: Secondary | ICD-10-CM

## 2018-11-21 DIAGNOSIS — Z7189 Other specified counseling: Secondary | ICD-10-CM

## 2018-11-21 DIAGNOSIS — Z950 Presence of cardiac pacemaker: Secondary | ICD-10-CM

## 2018-11-21 DIAGNOSIS — Z951 Presence of aortocoronary bypass graft: Secondary | ICD-10-CM | POA: Diagnosis not present

## 2018-11-21 DIAGNOSIS — E782 Mixed hyperlipidemia: Secondary | ICD-10-CM

## 2018-11-21 DIAGNOSIS — I5022 Chronic systolic (congestive) heart failure: Secondary | ICD-10-CM

## 2018-11-21 DIAGNOSIS — I10 Essential (primary) hypertension: Secondary | ICD-10-CM

## 2018-11-21 MED ORDER — CARVEDILOL 12.5 MG PO TABS: 12.5000 mg | ORAL_TABLET | Freq: Two times a day (BID) | ORAL | 3 refills | Status: DC

## 2018-11-21 NOTE — Patient Instructions (Signed)
Medication Instructions:  1) INCREASE Carvedilol 12.5mg  twice daily  If you need a refill on your cardiac medications before your next appointment, please call your pharmacy.   Lab work: None If you have labs (blood work) drawn today and your tests are completely normal, you will receive your results only by: Marland Kitchen MyChart Message (if you have MyChart) OR . A paper copy in the mail If you have any lab test that is abnormal or we need to change your treatment, we will call you to review the results.  Testing/Procedures: None  Follow-Up: At [facility], you and your health needs are our priority.  As part of our continuing mission to provide you with exceptional heart care, we have created designated Provider Care Teams.  These Care Teams include your primary Cardiologist (physician) and Advanced Practice Providers (APPs -  Physician Assistants and Nurse Practitioners) who all work together to provide you with the care you need, when you need it. You will need a follow up appointment in 4 months.  Please call our office 2 months in advance to schedule this appointment.  You may see Sinclair Grooms, MD or one of the following Advanced Practice Providers on your designated Care Team:   Truitt Merle, NP Cecilie Kicks, NP . Kathyrn Drown, NP  Any Other Special Instructions Will Be Listed Below (If Applicable).

## 2018-12-12 ENCOUNTER — Ambulatory Visit (INDEPENDENT_AMBULATORY_CARE_PROVIDER_SITE_OTHER): Payer: Medicare Other | Admitting: *Deleted

## 2018-12-12 DIAGNOSIS — I442 Atrioventricular block, complete: Secondary | ICD-10-CM

## 2018-12-12 LAB — CUP PACEART REMOTE DEVICE CHECK
Battery Remaining Longevity: 108 mo
Battery Voltage: 2.99 V
Brady Statistic AP VP Percent: 7.58 %
Brady Statistic AP VS Percent: 0.01 %
Brady Statistic AS VP Percent: 90.56 %
Brady Statistic AS VS Percent: 1.84 %
Brady Statistic RA Percent Paced: 8.14 %
Brady Statistic RV Percent Paced: 98.15 %
Date Time Interrogation Session: 20200916050409
Implantable Lead Implant Date: 20190614
Implantable Lead Implant Date: 20190614
Implantable Lead Implant Date: 20200213
Implantable Lead Implant Date: 20200213
Implantable Lead Location: 753858
Implantable Lead Location: 753858
Implantable Lead Location: 753859
Implantable Lead Location: 753860
Implantable Lead Model: 3830
Implantable Lead Model: 5071
Implantable Lead Model: 5071
Implantable Lead Model: 5076
Implantable Pulse Generator Implant Date: 20190614
Lead Channel Impedance Value: 304 Ohm
Lead Channel Impedance Value: 304 Ohm
Lead Channel Impedance Value: 418 Ohm
Lead Channel Impedance Value: 456 Ohm
Lead Channel Pacing Threshold Amplitude: 0.75 V
Lead Channel Pacing Threshold Amplitude: 1.25 V
Lead Channel Pacing Threshold Pulse Width: 0.4 ms
Lead Channel Pacing Threshold Pulse Width: 0.4 ms
Lead Channel Sensing Intrinsic Amplitude: 4.375 mV
Lead Channel Sensing Intrinsic Amplitude: 4.375 mV
Lead Channel Sensing Intrinsic Amplitude: 6.25 mV
Lead Channel Sensing Intrinsic Amplitude: 6.25 mV
Lead Channel Setting Pacing Amplitude: 1.5 V
Lead Channel Setting Pacing Amplitude: 2.5 V
Lead Channel Setting Pacing Pulse Width: 0.6 ms
Lead Channel Setting Sensing Sensitivity: 2 mV

## 2018-12-18 ENCOUNTER — Encounter: Payer: Self-pay | Admitting: Cardiology

## 2018-12-18 ENCOUNTER — Encounter: Payer: Self-pay | Admitting: *Deleted

## 2018-12-18 NOTE — Progress Notes (Signed)
Remote pacemaker transmission.   

## 2019-03-13 ENCOUNTER — Ambulatory Visit (INDEPENDENT_AMBULATORY_CARE_PROVIDER_SITE_OTHER): Payer: Medicare Other | Admitting: *Deleted

## 2019-03-13 DIAGNOSIS — R001 Bradycardia, unspecified: Secondary | ICD-10-CM | POA: Diagnosis not present
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
## 2019-03-14 LAB — CUP PACEART REMOTE DEVICE CHECK
Battery Remaining Longevity: 106 mo
Battery Voltage: 2.99 V
Brady Statistic AP VP Percent: 7.97 %
Brady Statistic AP VS Percent: 0 %
Brady Statistic AS VP Percent: 89.98 %
Brady Statistic AS VS Percent: 2.05 %
Brady Statistic RA Percent Paced: 8.6 %
Brady Statistic RV Percent Paced: 97.95 %
Date Time Interrogation Session: 20201216173408
Implantable Lead Implant Date: 20190614
Implantable Lead Implant Date: 20190614
Implantable Lead Implant Date: 20200213
Implantable Lead Implant Date: 20200213
Implantable Lead Location: 753858
Implantable Lead Location: 753858
Implantable Lead Location: 753859
Implantable Lead Location: 753860
Implantable Lead Model: 3830
Implantable Lead Model: 5071
Implantable Lead Model: 5071
Implantable Lead Model: 5076
Implantable Pulse Generator Implant Date: 20190614
Lead Channel Impedance Value: 304 Ohm
Lead Channel Impedance Value: 304 Ohm
Lead Channel Impedance Value: 437 Ohm
Lead Channel Impedance Value: 475 Ohm
Lead Channel Pacing Threshold Amplitude: 0.875 V
Lead Channel Pacing Threshold Amplitude: 1.125 V
Lead Channel Pacing Threshold Pulse Width: 0.4 ms
Lead Channel Pacing Threshold Pulse Width: 0.4 ms
Lead Channel Sensing Intrinsic Amplitude: 4.125 mV
Lead Channel Sensing Intrinsic Amplitude: 4.125 mV
Lead Channel Sensing Intrinsic Amplitude: 7 mV
Lead Channel Sensing Intrinsic Amplitude: 7 mV
Lead Channel Setting Pacing Amplitude: 1.75 V
Lead Channel Setting Pacing Amplitude: 2.5 V
Lead Channel Setting Pacing Pulse Width: 0.6 ms
Lead Channel Setting Sensing Sensitivity: 2 mV

## 2019-03-18 ENCOUNTER — Encounter: Payer: Self-pay | Admitting: Nurse Practitioner

## 2019-03-18 ENCOUNTER — Other Ambulatory Visit: Payer: Self-pay

## 2019-03-18 ENCOUNTER — Ambulatory Visit: Payer: Medicare Other | Admitting: Nurse Practitioner

## 2019-03-18 VITALS — BP 118/84 | HR 82 | Ht 70.0 in | Wt 208.0 lb

## 2019-03-18 DIAGNOSIS — I255 Ischemic cardiomyopathy: Secondary | ICD-10-CM | POA: Diagnosis not present

## 2019-03-18 DIAGNOSIS — I5022 Chronic systolic (congestive) heart failure: Secondary | ICD-10-CM

## 2019-03-18 DIAGNOSIS — Z951 Presence of aortocoronary bypass graft: Secondary | ICD-10-CM

## 2019-03-18 DIAGNOSIS — Z7189 Other specified counseling: Secondary | ICD-10-CM

## 2019-03-18 NOTE — Patient Instructions (Addendum)
After Visit Summary:  We will be checking the following labs today - BMET   Medication Instructions:    Continue with your current medicines.    If you need a refill on your cardiac medications before your next appointment, please call your pharmacy.     Testing/Procedures To Be Arranged:  Limited echo in January  Follow-Up:   See Dr. Caryl Comes in January a few days after a limited echo  See Dr. Tamala Julian in March 2021    At [facility], you and your health needs are our priority.  As part of our continuing mission to provide you with exceptional heart care, we have created designated Provider Care Teams.  These Care Teams include your primary Cardiologist (physician) and Advanced Practice Providers (APPs -  Physician Assistants and Nurse Practitioners) who all work together to provide you with the care you need, when you need it.  Special Instructions:  . Stay safe, stay home, wash your hands for at least 20 seconds and wear a mask when out in public.  . It was good to talk with you today.    Call the [place] office at [phone] if you have any questions, problems or concerns.

## 2019-03-19 LAB — BASIC METABOLIC PANEL
BUN/Creatinine Ratio: 18 (ref 10–24)
BUN: 23 mg/dL (ref 8–27)
CO2: 26 mmol/L (ref 20–29)
Calcium: 9.3 mg/dL (ref 8.6–10.2)
Chloride: 101 mmol/L (ref 96–106)
Creatinine, Ser: 1.27 mg/dL (ref 0.76–1.27)
GFR calc Af Amer: 64 mL/min/{1.73_m2} (ref >59)
GFR calc non Af Amer: 56 mL/min/{1.73_m2} — ABNORMAL LOW (ref >59)
Glucose: 124 mg/dL — ABNORMAL HIGH (ref 65–99)
Potassium: 4 mmol/L (ref 3.5–5.2)
Sodium: 141 mmol/L (ref 134–144)

## 2019-03-25 ENCOUNTER — Ambulatory Visit: Payer: Medicare Other | Admitting: Nurse Practitioner

## 2019-04-17 ENCOUNTER — Ambulatory Visit (HOSPITAL_COMMUNITY): Payer: Medicare Other | Attending: Cardiovascular Disease

## 2019-04-17 ENCOUNTER — Other Ambulatory Visit: Payer: Self-pay

## 2019-04-17 DIAGNOSIS — I255 Ischemic cardiomyopathy: Secondary | ICD-10-CM

## 2019-04-17 DIAGNOSIS — I5022 Chronic systolic (congestive) heart failure: Secondary | ICD-10-CM | POA: Diagnosis not present

## 2019-04-17 DIAGNOSIS — Z951 Presence of aortocoronary bypass graft: Secondary | ICD-10-CM | POA: Diagnosis not present

## 2019-04-17 MED ORDER — PERFLUTREN LIPID MICROSPHERE
1.0000 mL | INTRAVENOUS | Status: AC | PRN
  Administered 2019-04-17: 3 mL via INTRAVENOUS

## 2019-04-23 DIAGNOSIS — I255 Ischemic cardiomyopathy: Secondary | ICD-10-CM | POA: Insufficient documentation

## 2019-04-24 ENCOUNTER — Ambulatory Visit: Payer: Medicare Other | Admitting: Internal Medicine

## 2019-04-24 ENCOUNTER — Encounter: Payer: Self-pay | Admitting: Internal Medicine

## 2019-04-24 ENCOUNTER — Other Ambulatory Visit: Payer: Self-pay

## 2019-04-24 VITALS — BP 108/64 | HR 83 | Ht 70.0 in | Wt 208.0 lb

## 2019-04-24 DIAGNOSIS — I442 Atrioventricular block, complete: Secondary | ICD-10-CM

## 2019-04-24 DIAGNOSIS — Z950 Presence of cardiac pacemaker: Secondary | ICD-10-CM | POA: Diagnosis not present

## 2019-04-24 DIAGNOSIS — I5023 Acute on chronic systolic (congestive) heart failure: Secondary | ICD-10-CM

## 2019-04-24 DIAGNOSIS — I255 Ischemic cardiomyopathy: Secondary | ICD-10-CM | POA: Diagnosis not present
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
## 2019-04-24 NOTE — Patient Instructions (Signed)

## 2019-05-24 IMAGING — CR CHEST - 2 VIEW
2 series · 2 of 2 positions shown · non-contrast
Comparison: 05/16/2018

CLINICAL DATA: Status post CABG.  Shortness of breath.

EXAM:
CHEST - 2 VIEW

[w chest pa]
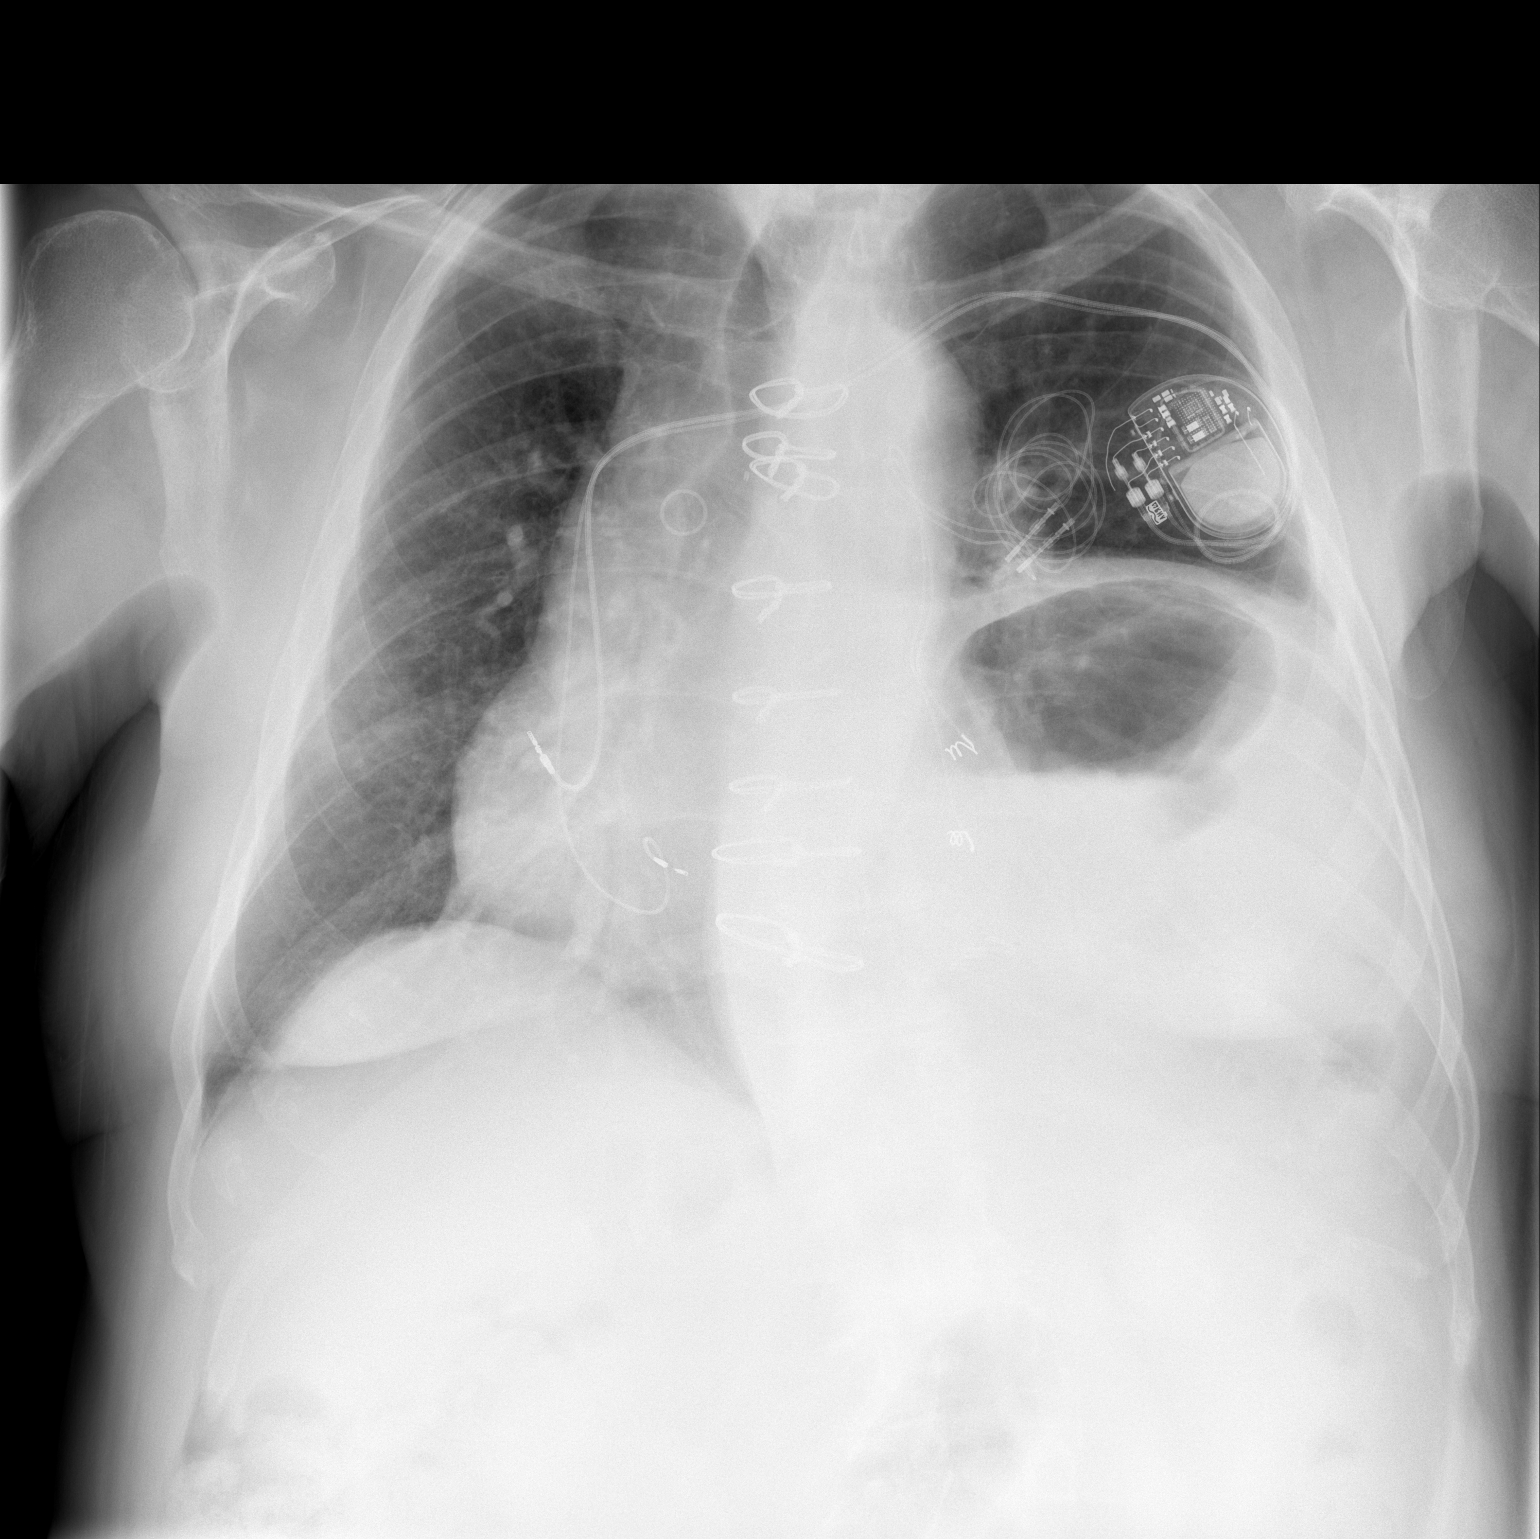

[w chest lat]
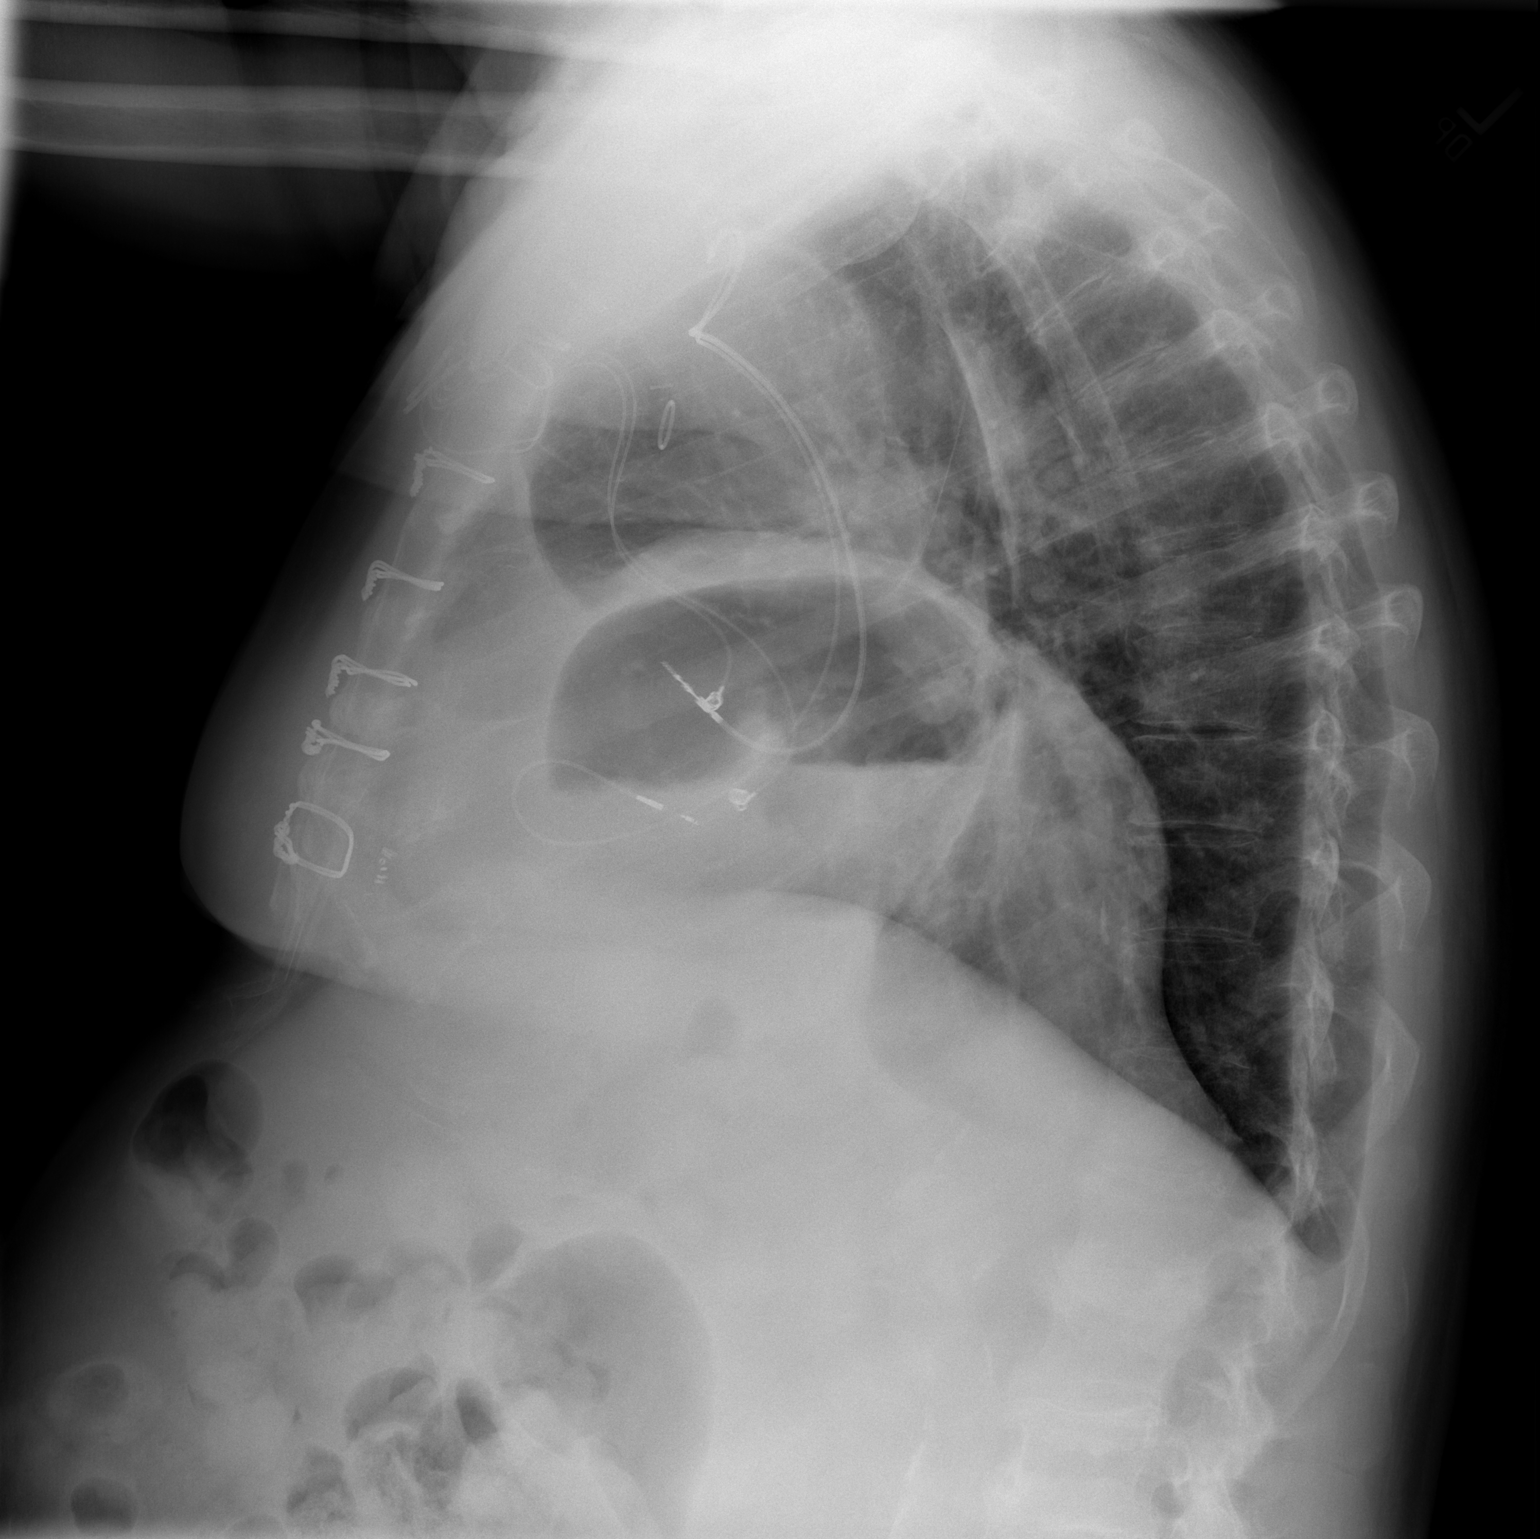

[2 of 2 positions shown; findings below may reference images not displayed]

FINDINGS: There is a left chest wall pacer device with leads in the right
atrial appendage and right ventricle. Marked asymmetric elevation of
left hemidiaphragm noted. No pleural effusion or edema. No airspace
opacities
IMPRESSION: 1. No acute cardiopulmonary abnormalities.
2. Marked asymmetric elevation of left hemidiaphragm as before.
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
## 2019-06-10 ENCOUNTER — Ambulatory Visit: Payer: Medicare Other | Admitting: Thoracic Surgery (Cardiothoracic Vascular Surgery)

## 2019-06-10 ENCOUNTER — Encounter: Payer: Self-pay | Admitting: Interventional Cardiology

## 2019-06-10 ENCOUNTER — Ambulatory Visit: Payer: Medicare Other | Admitting: Interventional Cardiology

## 2019-06-10 ENCOUNTER — Other Ambulatory Visit: Payer: Self-pay

## 2019-06-10 VITALS — BP 118/76 | HR 80 | Ht 70.0 in | Wt 208.0 lb

## 2019-06-10 DIAGNOSIS — E782 Mixed hyperlipidemia: Secondary | ICD-10-CM | POA: Diagnosis not present

## 2019-06-10 DIAGNOSIS — Z950 Presence of cardiac pacemaker: Secondary | ICD-10-CM | POA: Diagnosis not present

## 2019-06-10 DIAGNOSIS — Z951 Presence of aortocoronary bypass graft: Secondary | ICD-10-CM

## 2019-06-10 DIAGNOSIS — Z7189 Other specified counseling: Secondary | ICD-10-CM

## 2019-06-10 DIAGNOSIS — I10 Essential (primary) hypertension: Secondary | ICD-10-CM

## 2019-06-10 DIAGNOSIS — I5022 Chronic systolic (congestive) heart failure: Secondary | ICD-10-CM | POA: Diagnosis not present

## 2019-06-10 NOTE — Patient Instructions (Signed)

## 2019-06-12 ENCOUNTER — Ambulatory Visit (INDEPENDENT_AMBULATORY_CARE_PROVIDER_SITE_OTHER): Payer: Medicare Other | Admitting: *Deleted

## 2019-06-12 DIAGNOSIS — R001 Bradycardia, unspecified: Secondary | ICD-10-CM | POA: Diagnosis not present

## 2019-06-12 LAB — CUP PACEART REMOTE DEVICE CHECK
Battery Remaining Longevity: 106 mo
Battery Voltage: 2.99 V
Brady Statistic AP VP Percent: 13 %
Brady Statistic AP VS Percent: 0.01 %
Brady Statistic AS VP Percent: 83.43 %
Brady Statistic AS VS Percent: 3.56 %
Brady Statistic RA Percent Paced: 14.73 %
Brady Statistic RV Percent Paced: 96.43 %
Date Time Interrogation Session: 20210317121012
Implantable Lead Implant Date: 20190614
Implantable Lead Implant Date: 20190614
Implantable Lead Implant Date: 20200213
Implantable Lead Implant Date: 20200213
Implantable Lead Location: 753858
Implantable Lead Location: 753858
Implantable Lead Location: 753859
Implantable Lead Location: 753860
Implantable Lead Model: 3830
Implantable Lead Model: 5071
Implantable Lead Model: 5071
Implantable Lead Model: 5076
Implantable Pulse Generator Implant Date: 20190614
Lead Channel Impedance Value: 342 Ohm
Lead Channel Impedance Value: 342 Ohm
Lead Channel Impedance Value: 475 Ohm
Lead Channel Impedance Value: 494 Ohm
Lead Channel Pacing Threshold Amplitude: 0.875 V
Lead Channel Pacing Threshold Amplitude: 1 V
Lead Channel Pacing Threshold Pulse Width: 0.4 ms
Lead Channel Pacing Threshold Pulse Width: 0.4 ms
Lead Channel Sensing Intrinsic Amplitude: 5.125 mV
Lead Channel Sensing Intrinsic Amplitude: 5.125 mV
Lead Channel Sensing Intrinsic Amplitude: 7 mV
Lead Channel Sensing Intrinsic Amplitude: 7 mV
Lead Channel Setting Pacing Amplitude: 1.75 V
Lead Channel Setting Pacing Amplitude: 2.5 V
Lead Channel Setting Pacing Pulse Width: 0.6 ms
Lead Channel Setting Sensing Sensitivity: 2 mV

## 2019-06-12 NOTE — Progress Notes (Signed)
PPM Remote  

## 2019-06-30 ENCOUNTER — Other Ambulatory Visit: Payer: Self-pay | Admitting: Internal Medicine

## 2019-07-01 ENCOUNTER — Telehealth (INDEPENDENT_AMBULATORY_CARE_PROVIDER_SITE_OTHER): Payer: Medicare Other | Admitting: Thoracic Surgery (Cardiothoracic Vascular Surgery)

## 2019-07-01 ENCOUNTER — Other Ambulatory Visit: Payer: Self-pay | Admitting: Internal Medicine

## 2019-07-01 ENCOUNTER — Other Ambulatory Visit: Payer: Self-pay

## 2019-07-01 DIAGNOSIS — Z951 Presence of aortocoronary bypass graft: Secondary | ICD-10-CM | POA: Diagnosis not present

## 2019-07-01 NOTE — Progress Notes (Signed)
[address]       [place] [address]             [phone]     CARDIOTHORACIC SURGERY TELEPHONE VIRTUAL OFFICE NOTE  Primary Cardiologist is Sinclair Grooms, MD  Primary Electrophysiologist is Deboraha Sprang, MD PCP is Lajean Manes, MD   HPI:  I spoke with Brian [name] (DOB 12/18/1945 ) via telephone on 07/01/2019 at 4:49 PM and verified that I was speaking with the correct person using more than one form of identification.  We discussed the reason(s) for conducting our visit virtually instead of in-person.  The patient expressed understanding the circumstances and agreed to proceed as described.   Patient is a 74 year old male with history of hypertension and complete heart block status post permanent pacemaker placement in 2019 who underwent coronary artery bypass grafting x3 on May 10, 2018 for severe three-vessel coronary artery disease with severe left ventricular systolic dysfunction and preoperative symptoms of acute exacerbation of chronic combined systolic and diastolic congestive heart failure.  Epicardial left ventricular pacemaker leads were placed at the time of surgery because of the presence of baseline bundle branch block and severe left ventricular systolic dysfunction.  The patient has done remarkably well postoperatively and was last seen here in our office on September 24, 2018 at which time he was doing well.  Since then he has continued to do very well.  He has been followed carefully by Dr. Tamala Julian and Dr. Caryl Comes.  Recent follow-up echocardiogram revealed significant improvement left ventricular systolic function with ejection fraction estimated 45%.  I spoke with patient over the telephone today for routine follow-up 1 year after his surgery.  Patient states that he is doing remarkably well and he feels like a completely different man since he underwent bypass surgery.  He states that he feels better than he has felt in more than 15 years.  He  is quite active and working 60 hours/week at the [employer].  He states that he only gets short of breath if he really pushes himself physically and he never gets short of breath with ordinary activity.  He is delighted with his outcome.   Current Outpatient Medications  Medication Sig Dispense Refill  . acetaminophen (TYLENOL) 500 MG tablet Take 500 mg by mouth every 6 (six) hours as needed.    . Ascorbic Acid (VITAMIN C) 1000 MG tablet Take 1,000 mg by mouth daily.    Marland Kitchen aspirin EC 81 MG tablet Take 81 mg by mouth daily.    Marland Kitchen atorvastatin (LIPITOR) 80 MG tablet Take 1 tablet (80 mg total) by mouth daily at 6 PM. 90 tablet 3  . carvedilol (COREG) 6.25 MG tablet Take 6.25 mg by mouth 2 (two) times daily.    . cycloSPORINE (RESTASIS) 0.05 % ophthalmic emulsion Place 1 drop into both eyes 2 (two) times daily. 0.4 mL   . FERROUS SULFATE PO Take 65 mg by mouth 2 (two) times a week.    . furosemide (LASIX) 40 MG tablet Take 1 tablet (40 mg total) by mouth 2 (two) times daily.    . Multiple Vitamins-Minerals (EQ VISION FORMULA 50+) CAPS Take 1 capsule by mouth daily.    . sacubitril-valsartan (ENTRESTO) 49-51 MG Take 1 tablet by mouth 2 (two) times daily. 60 tablet 11   No current facility-administered medications for this visit.     Diagnostic Tests:  ECHOCARDIOGRAM LIMITED REPORT  Patient Name:  Brian [name] Date of Exam: 04/17/2019  Medical Rec #: [id number]   Height:    70.0 in  Accession #:  [id number]   Weight:    208.0 lb  Date of Birth: Jul 29, 1945   BSA:     2.12 m  Patient Age:  73 years    BP:      118/84 mmHg  Patient Gender: M       HR:      74 bpm.  Exam Location: [address]     Procedure: 2D Echo, Limited Echo, Limited Color Doppler, Cardiac Doppler  and       Intracardiac Opacification Agent   Indications:  I42.0 Dilated Cardiomyopathy    History:    Patient has prior history of  Echocardiogram examinations,  most         recent 08/21/2018. Coronary artery disease. Chronic  systolic         heart failure. Chronic kidney disease. Pneumonia.    Sonographer:  NaTashia Rodgers-Jones RDCS  Referring Phys: [id number] LORI C GERHARDT   IMPRESSIONS    1. Left ventricular ejection fraction, by visual estimation, is 40 to  45%. The left ventricle has mild to moderately decreased function. There  is no increased left ventricular wall thickness.  2. Moderately dilated left ventricular internal cavity size.  3. The left ventricle demonstrates regional wall motion abnormalities.  4. Poor visualization of endocardium even with definity septum and apex  seem severely hypokinetic.  5. Global right ventricle has normal systolc function.The right  ventricular size is normal. no increase in right ventricular wall  thickness.  6. Pacing wires in RA/RV.  7. Left atrial size was mildly dilated.  8. The mitral valve is normal in structure. Trivial mitral valve  regurgitation. No evidence of mitral stenosis.  9. The tricuspid valve was normal in structure. Tricuspid valve  regurgitation is mild.  10. Tricuspid valve regurgitation is mild.  11. Mild to moderate aortic valve sclerosis/calcification without any  evidence of aortic stenosis.  12. The pulmonic valve was normal in structure.  13. Aortic dilatation noted.  14. There is mild dilatation of the ascending aorta measuring 39 mm.  15. The inferior vena cava is normal in size with greater than 50%  respiratory variability, suggesting right atrial pressure of 3 mmHg.   FINDINGS  Left Ventricle: Left ventricular ejection fraction, by visual estimation,  is 40 to 45%. The left ventricle has mild to moderately decreased  function. The left ventricle demonstrates regional wall motion  abnormalities. The left ventricular internal  cavity size was moderately dilated left ventricle. There is no increased    left ventricular wall thickness. Left ventricular diastolic parameters  were normal. Normal left atrial pressure. Poor visualization of  endocardium even with definity septum and apex  seem severely hypokinetic.   Right Ventricle: The right ventricular size is normal. No increase in  right ventricular wall thickness. Global RV systolic function is has  normal systolic function. Pacing wires in RA/RV.   Left Atrium: Left atrial size was mildly dilated.   Right Atrium: Right atrial size was normal in size. Right atrial pressure  is estimated at 10 mmHg.   Pericardium: There is no evidence of pericardial effusion is seen. There  is no evidence of pericardial effusion.   Mitral Valve: The mitral valve is normal in structure. No evidence of  mitral valve stenosis by observation. MV Area by PHT, 3.17 cm. MV PHT,  69.31 msec. Trivial  mitral valve regurgitation.   Tricuspid Valve: The tricuspid valve is normal in structure. Tricuspid  valve regurgitation is mild.   Aortic Valve: The aortic valve is tricuspid. Aortic valve regurgitation is  not visualized. Mild to moderate aortic valve sclerosis/calcification is  present, without any evidence of aortic stenosis.   Pulmonic Valve: The pulmonic valve was normal in structure. Pulmonic valve  regurgitation is not visualized by color flow Doppler. Pulmonic  regurgitation is not visualized by color flow Doppler.   Aorta: The aortic root, ascending aorta and aortic arch are all  structurally normal, with no evidence of dilitation or obstruction and  aortic dilatation noted. There is mild dilatation of the ascending aorta  measuring 39 mm.   Venous: The inferior vena cava is normal in size with greater than 50%  respiratory variability, suggesting right atrial pressure of 3 mmHg.   Shunts: There is no evidence of a patent foramen ovale. No ventricular  septal defect is seen or detected. There is no evidence of an atrial  septal defect.  No atrial level shunt detected by color flow Doppler.     LEFT VENTRICLE     Normals  PLAX 2D  LVIDd:     4.20 cm 3.6 cm  Diastology         Normals  LVIDs:     3.40 cm 1.7 cm  LV e' lateral:  7.29 cm/s 6.42 cm/s  LV PW:     0.80 cm 1.4 cm  LV E/e' lateral: 13.3   15.4  LV IVS:    0.90 cm 1.3 cm  LV e' medial:  6.09 cm/s 6.96 cm/s  LVOT diam:   2.30 cm 2.0 cm  LV E/e' medial: 15.9   6.96  LV SV:     31 ml  79 ml  LV SV Index:  14.27  45 ml/m2  LVOT Area:   4.15 cm 3.14 cm2     LEFT ATRIUM     Index  LA diam:  3.80 cm 1.79 cm/m  AORTIC VALVE       Normals  LVOT Vmax:  67.20 cm/s  LVOT Vmean: 40.700 cm/s 75 cm/s  LVOT VTI:  0.113 m   25.3 cm    AORTA         Normals  Ao Root diam: 3.80 cm 31 mm  Ao Asc diam: 3.90 cm 31 mm   MITRAL VALVE       Normals  MV Area (PHT): 3.17 cm       SHUNTS  MV PHT:    69.31 msec 55 ms   Systemic VTI: 0.11 m  MV Decel Time: 239 msec  187 ms   Systemic Diam: 2.30 cm  MV E velocity: 97.00 cm/s 103 cm/s  MV A velocity: 117.00 cm/s 70.3 cm/s  MV E/A ratio: 0.83    1.5     Charlton Haws MD  Electronically signed by Charlton Haws MD  Signature Date/Time: 04/17/2019/5:27:46 PMThe mitral valve is normal in  structure.      Impression:  Patient is doing remarkably well more than 1 year status post coronary artery bypass grafting  Plan:  We have not recommended any change the patient's current medications.  The patient will continue to follow-up regularly with Dr. Katrinka Blazing and Dr. Graciela Husbands.  In the future the patient will return to see [place] only should specific problems or questions arise.  All questions answered.    I discussed limitations of evaluation and management via telephone.  The  patient was advised to call back for repeat telephone consultation or to seek an in-person evaluation if questions arise or the patient's  clinical condition changes in any significant manner.  I spent in excess of 5 minutes of non-face-to-face time during the conduct of this telephone virtual office consultation.     Salvatore Decent. Cornelius Moras, MD 07/01/2019 4:49 PM

## 2019-07-01 NOTE — Patient Instructions (Signed)
Continue all previous medications without any changes at this time  You may resume unrestricted physical activity without any particular limitations at this time.   

## 2019-07-09 DIAGNOSIS — I129 Hypertensive chronic kidney disease with stage 1 through stage 4 chronic kidney disease, or unspecified chronic kidney disease: Secondary | ICD-10-CM | POA: Diagnosis not present

## 2019-07-09 DIAGNOSIS — Z1389 Encounter for screening for other disorder: Secondary | ICD-10-CM | POA: Diagnosis not present

## 2019-07-09 DIAGNOSIS — Z0000 Encounter for general adult medical examination without abnormal findings: Secondary | ICD-10-CM | POA: Diagnosis not present

## 2019-07-09 DIAGNOSIS — E782 Mixed hyperlipidemia: Secondary | ICD-10-CM | POA: Diagnosis not present

## 2019-07-09 DIAGNOSIS — I442 Atrioventricular block, complete: Secondary | ICD-10-CM | POA: Diagnosis not present

## 2019-08-06 DIAGNOSIS — Z79899 Other long term (current) drug therapy: Secondary | ICD-10-CM | POA: Diagnosis not present

## 2019-08-27 DIAGNOSIS — Z79899 Other long term (current) drug therapy: Secondary | ICD-10-CM | POA: Diagnosis not present

## 2019-09-11 ENCOUNTER — Ambulatory Visit (INDEPENDENT_AMBULATORY_CARE_PROVIDER_SITE_OTHER): Payer: Medicare Other | Admitting: *Deleted

## 2019-09-11 DIAGNOSIS — I442 Atrioventricular block, complete: Secondary | ICD-10-CM

## 2019-09-11 LAB — CUP PACEART REMOTE DEVICE CHECK
Battery Remaining Longevity: 103 mo
Battery Voltage: 2.99 V
Brady Statistic AP VP Percent: 10.65 %
Brady Statistic AP VS Percent: 0 %
Brady Statistic AS VP Percent: 85.06 %
Brady Statistic AS VS Percent: 4.28 %
Brady Statistic RA Percent Paced: 11.81 %
Brady Statistic RV Percent Paced: 95.72 %
Date Time Interrogation Session: 20210616013833
Implantable Lead Implant Date: 20190614
Implantable Lead Implant Date: 20190614
Implantable Lead Implant Date: 20200213
Implantable Lead Implant Date: 20200213
Implantable Lead Location: 753858
Implantable Lead Location: 753858
Implantable Lead Location: 753859
Implantable Lead Location: 753860
Implantable Lead Model: 3830
Implantable Lead Model: 5071
Implantable Lead Model: 5071
Implantable Lead Model: 5076
Implantable Pulse Generator Implant Date: 20190614
Lead Channel Impedance Value: 323 Ohm
Lead Channel Impedance Value: 342 Ohm
Lead Channel Impedance Value: 475 Ohm
Lead Channel Impedance Value: 475 Ohm
Lead Channel Pacing Threshold Amplitude: 0.75 V
Lead Channel Pacing Threshold Amplitude: 1 V
Lead Channel Pacing Threshold Pulse Width: 0.4 ms
Lead Channel Pacing Threshold Pulse Width: 0.4 ms
Lead Channel Sensing Intrinsic Amplitude: 4.125 mV
Lead Channel Sensing Intrinsic Amplitude: 4.125 mV
Lead Channel Sensing Intrinsic Amplitude: 8.75 mV
Lead Channel Sensing Intrinsic Amplitude: 8.75 mV
Lead Channel Setting Pacing Amplitude: 1.5 V
Lead Channel Setting Pacing Amplitude: 2.5 V
Lead Channel Setting Pacing Pulse Width: 0.6 ms
Lead Channel Setting Sensing Sensitivity: 2 mV

## 2019-09-12 NOTE — Progress Notes (Signed)
Remote pacemaker transmission.   

## 2019-09-14 DIAGNOSIS — H04123 Dry eye syndrome of bilateral lacrimal glands: Secondary | ICD-10-CM | POA: Diagnosis not present

## 2019-09-14 DIAGNOSIS — H40033 Anatomical narrow angle, bilateral: Secondary | ICD-10-CM | POA: Diagnosis not present

## 2019-09-17 DIAGNOSIS — N390 Urinary tract infection, site not specified: Secondary | ICD-10-CM | POA: Diagnosis not present

## 2019-10-21 DIAGNOSIS — T162XXA Foreign body in left ear, initial encounter: Secondary | ICD-10-CM | POA: Diagnosis not present

## 2019-10-21 DIAGNOSIS — H9202 Otalgia, left ear: Secondary | ICD-10-CM | POA: Diagnosis not present

## 2019-10-24 ENCOUNTER — Other Ambulatory Visit: Payer: Self-pay | Admitting: Interventional Cardiology

## 2019-12-11 ENCOUNTER — Ambulatory Visit (INDEPENDENT_AMBULATORY_CARE_PROVIDER_SITE_OTHER): Payer: Medicare Other | Admitting: *Deleted

## 2019-12-11 DIAGNOSIS — I442 Atrioventricular block, complete: Secondary | ICD-10-CM | POA: Diagnosis not present

## 2019-12-11 LAB — CUP PACEART REMOTE DEVICE CHECK
Battery Remaining Longevity: 99 mo
Battery Voltage: 2.98 V
Brady Statistic AP VP Percent: 16.34 %
Brady Statistic AP VS Percent: 0.01 %
Brady Statistic AS VP Percent: 74.13 %
Brady Statistic AS VS Percent: 9.52 %
Brady Statistic RA Percent Paced: 19.91 %
Brady Statistic RV Percent Paced: 90.47 %
Date Time Interrogation Session: 20210915014643
Implantable Lead Implant Date: 20190614
Implantable Lead Implant Date: 20190614
Implantable Lead Implant Date: 20200213
Implantable Lead Implant Date: 20200213
Implantable Lead Location: 753858
Implantable Lead Location: 753858
Implantable Lead Location: 753859
Implantable Lead Location: 753860
Implantable Lead Model: 3830
Implantable Lead Model: 5071
Implantable Lead Model: 5071
Implantable Lead Model: 5076
Implantable Pulse Generator Implant Date: 20190614
Lead Channel Impedance Value: 323 Ohm
Lead Channel Impedance Value: 323 Ohm
Lead Channel Impedance Value: 418 Ohm
Lead Channel Impedance Value: 475 Ohm
Lead Channel Pacing Threshold Amplitude: 0.625 V
Lead Channel Pacing Threshold Amplitude: 1 V
Lead Channel Pacing Threshold Pulse Width: 0.4 ms
Lead Channel Pacing Threshold Pulse Width: 0.4 ms
Lead Channel Sensing Intrinsic Amplitude: 3.625 mV
Lead Channel Sensing Intrinsic Amplitude: 3.625 mV
Lead Channel Sensing Intrinsic Amplitude: 5.375 mV
Lead Channel Sensing Intrinsic Amplitude: 5.375 mV
Lead Channel Setting Pacing Amplitude: 1.5 V
Lead Channel Setting Pacing Amplitude: 2.5 V
Lead Channel Setting Pacing Pulse Width: 0.6 ms
Lead Channel Setting Sensing Sensitivity: 2 mV

## 2019-12-12 NOTE — Progress Notes (Signed)
Remote pacemaker transmission.   

## 2019-12-29 ENCOUNTER — Other Ambulatory Visit: Payer: Self-pay | Admitting: Internal Medicine

## 2020-01-06 DIAGNOSIS — I129 Hypertensive chronic kidney disease with stage 1 through stage 4 chronic kidney disease, or unspecified chronic kidney disease: Secondary | ICD-10-CM | POA: Diagnosis not present

## 2020-01-06 DIAGNOSIS — N1831 Chronic kidney disease, stage 3a: Secondary | ICD-10-CM | POA: Diagnosis not present

## 2020-03-11 ENCOUNTER — Ambulatory Visit (INDEPENDENT_AMBULATORY_CARE_PROVIDER_SITE_OTHER): Payer: Medicare Other

## 2020-03-11 DIAGNOSIS — I255 Ischemic cardiomyopathy: Secondary | ICD-10-CM

## 2020-03-11 LAB — CUP PACEART REMOTE DEVICE CHECK
Battery Remaining Longevity: 94 mo
Battery Voltage: 2.98 V
Brady Statistic AP VP Percent: 11.66 %
Brady Statistic AP VS Percent: 0.01 %
Brady Statistic AS VP Percent: 80.98 %
Brady Statistic AS VS Percent: 7.35 %
Brady Statistic RA Percent Paced: 14.16 %
Brady Statistic RV Percent Paced: 92.65 %
Date Time Interrogation Session: 20211214235755
Implantable Lead Implant Date: 20190614
Implantable Lead Implant Date: 20190614
Implantable Lead Implant Date: 20200213
Implantable Lead Implant Date: 20200213
Implantable Lead Location: 753858
Implantable Lead Location: 753858
Implantable Lead Location: 753859
Implantable Lead Location: 753860
Implantable Lead Model: 3830
Implantable Lead Model: 5071
Implantable Lead Model: 5071
Implantable Lead Model: 5076
Implantable Pulse Generator Implant Date: 20190614
Lead Channel Impedance Value: 323 Ohm
Lead Channel Impedance Value: 323 Ohm
Lead Channel Impedance Value: 437 Ohm
Lead Channel Impedance Value: 456 Ohm
Lead Channel Pacing Threshold Amplitude: 0.625 V
Lead Channel Pacing Threshold Amplitude: 0.875 V
Lead Channel Pacing Threshold Pulse Width: 0.4 ms
Lead Channel Pacing Threshold Pulse Width: 0.4 ms
Lead Channel Sensing Intrinsic Amplitude: 4.375 mV
Lead Channel Sensing Intrinsic Amplitude: 4.375 mV
Lead Channel Sensing Intrinsic Amplitude: 9.25 mV
Lead Channel Sensing Intrinsic Amplitude: 9.25 mV
Lead Channel Setting Pacing Amplitude: 1.5 V
Lead Channel Setting Pacing Amplitude: 2.5 V
Lead Channel Setting Pacing Pulse Width: 0.6 ms
Lead Channel Setting Sensing Sensitivity: 2 mV

## 2020-03-25 ENCOUNTER — Other Ambulatory Visit: Payer: Self-pay

## 2020-03-25 MED ORDER — ATORVASTATIN CALCIUM 80 MG PO TABS: 80.0000 mg | ORAL_TABLET | Freq: Every day | ORAL | 0 refills | Status: DC

## 2020-03-25 NOTE — Progress Notes (Signed)
Remote pacemaker transmission.   

## 2020-04-08 DIAGNOSIS — H353134 Nonexudative age-related macular degeneration, bilateral, advanced atrophic with subfoveal involvement: Secondary | ICD-10-CM | POA: Diagnosis not present

## 2020-04-08 DIAGNOSIS — D3131 Benign neoplasm of right choroid: Secondary | ICD-10-CM | POA: Diagnosis not present

## 2020-04-08 DIAGNOSIS — H02532 Eyelid retraction right lower eyelid: Secondary | ICD-10-CM | POA: Diagnosis not present

## 2020-04-08 DIAGNOSIS — H04123 Dry eye syndrome of bilateral lacrimal glands: Secondary | ICD-10-CM | POA: Diagnosis not present

## 2020-04-29 ENCOUNTER — Other Ambulatory Visit: Payer: Self-pay

## 2020-04-29 ENCOUNTER — Encounter: Payer: Self-pay | Admitting: Internal Medicine

## 2020-04-29 ENCOUNTER — Ambulatory Visit: Payer: Medicare Other | Admitting: Internal Medicine

## 2020-04-29 VITALS — BP 130/76 | HR 95 | Ht 70.0 in | Wt 207.0 lb

## 2020-04-29 DIAGNOSIS — Z950 Presence of cardiac pacemaker: Secondary | ICD-10-CM | POA: Diagnosis not present

## 2020-04-29 DIAGNOSIS — I442 Atrioventricular block, complete: Secondary | ICD-10-CM | POA: Diagnosis not present
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
## 2020-04-29 NOTE — Patient Instructions (Signed)
Medication Instructions:  Your physician recommends that you continue on your current medications as directed. Please refer to the Current Medication list given to you today.  Labwork: None ordered.  Testing/Procedures: None ordered.  Follow-Up: Your physician wants you to follow-up in: one year with Lewayne Bunting, MD or one of the following Advanced Practice Providers on your designated Care Team:    Gypsy Balsam, NP  Francis Dowse, PA-C  Casimiro Needle "Mardelle Matte" [place], [place]  Remote monitoring is used to monitor your Pacemaker from home. This monitoring reduces the number of office visits required to check your device to one time per year. It allows [place] to keep an eye on the functioning of your device to ensure it is working properly. You are scheduled for a device check from home on 06/10/2020. You may send your transmission at any time that day. If you have a wireless device, the transmission will be sent automatically. After your physician reviews your transmission, you will receive a postcard with your next transmission date.  Any Other Special Instructions Will Be Listed Below (If Applicable).  If you need a refill on your cardiac medications before your next appointment, please call your pharmacy.

## 2020-06-10 ENCOUNTER — Ambulatory Visit (INDEPENDENT_AMBULATORY_CARE_PROVIDER_SITE_OTHER): Payer: Medicare Other

## 2020-06-10 DIAGNOSIS — I255 Ischemic cardiomyopathy: Secondary | ICD-10-CM | POA: Diagnosis not present

## 2020-06-11 LAB — CUP PACEART REMOTE DEVICE CHECK
Battery Remaining Longevity: 91 mo
Battery Voltage: 2.98 V
Brady Statistic AP VP Percent: 9.77 %
Brady Statistic AP VS Percent: 0 %
Brady Statistic AS VP Percent: 85.77 %
Brady Statistic AS VS Percent: 4.45 %
Brady Statistic RA Percent Paced: 11.13 %
Brady Statistic RV Percent Paced: 95.55 %
Date Time Interrogation Session: 20220316023105
Implantable Lead Implant Date: 20190614
Implantable Lead Implant Date: 20190614
Implantable Lead Implant Date: 20200213
Implantable Lead Implant Date: 20200213
Implantable Lead Location: 753858
Implantable Lead Location: 753858
Implantable Lead Location: 753859
Implantable Lead Location: 753860
Implantable Lead Model: 3830
Implantable Lead Model: 5071
Implantable Lead Model: 5071
Implantable Lead Model: 5076
Implantable Pulse Generator Implant Date: 20190614
Lead Channel Impedance Value: 323 Ohm
Lead Channel Impedance Value: 323 Ohm
Lead Channel Impedance Value: 475 Ohm
Lead Channel Impedance Value: 494 Ohm
Lead Channel Pacing Threshold Amplitude: 0.75 V
Lead Channel Pacing Threshold Amplitude: 1 V
Lead Channel Pacing Threshold Pulse Width: 0.4 ms
Lead Channel Pacing Threshold Pulse Width: 0.4 ms
Lead Channel Sensing Intrinsic Amplitude: 3.875 mV
Lead Channel Sensing Intrinsic Amplitude: 3.875 mV
Lead Channel Sensing Intrinsic Amplitude: 4.625 mV
Lead Channel Sensing Intrinsic Amplitude: 4.625 mV
Lead Channel Setting Pacing Amplitude: 1.5 V
Lead Channel Setting Pacing Amplitude: 2.5 V
Lead Channel Setting Pacing Pulse Width: 0.6 ms
Lead Channel Setting Sensing Sensitivity: 2 mV

## 2020-06-18 NOTE — Progress Notes (Signed)
Remote pacemaker transmission.   

## 2020-06-21 ENCOUNTER — Other Ambulatory Visit: Payer: Self-pay | Admitting: Interventional Cardiology

## 2020-07-13 DIAGNOSIS — I442 Atrioventricular block, complete: Secondary | ICD-10-CM | POA: Diagnosis not present

## 2020-07-13 DIAGNOSIS — Z0000 Encounter for general adult medical examination without abnormal findings: Secondary | ICD-10-CM | POA: Diagnosis not present

## 2020-07-13 DIAGNOSIS — E782 Mixed hyperlipidemia: Secondary | ICD-10-CM | POA: Diagnosis not present

## 2020-07-13 DIAGNOSIS — I129 Hypertensive chronic kidney disease with stage 1 through stage 4 chronic kidney disease, or unspecified chronic kidney disease: Secondary | ICD-10-CM | POA: Diagnosis not present

## 2020-07-23 ENCOUNTER — Other Ambulatory Visit: Payer: Self-pay | Admitting: Interventional Cardiology

## 2020-07-24 MED ORDER — ENTRESTO 49-51 MG PO TABS: 1.0000 | ORAL_TABLET | Freq: Two times a day (BID) | ORAL | 0 refills | Status: DC

## 2020-07-29 ENCOUNTER — Telehealth: Payer: Self-pay | Admitting: Interventional Cardiology

## 2020-07-29 ENCOUNTER — Other Ambulatory Visit: Payer: Self-pay | Admitting: Interventional Cardiology

## 2020-07-29 MED ORDER — ENTRESTO 49-51 MG PO TABS: 1.0000 | ORAL_TABLET | Freq: Two times a day (BID) | ORAL | 2 refills | Status: DC

## 2020-07-29 NOTE — Telephone Encounter (Signed)
Pt's medication was sent to pt's pharmacy as requested. Confirmation received.  °

## 2020-07-29 NOTE — Telephone Encounter (Signed)
*  STAT* If patient is at the pharmacy, call can be transferred to refill team.   1. Which medications need to be refilled? (please list name of each medication and dose if known)  sacubitril-valsartan (ENTRESTO) 49-51 MG  2. Which pharmacy/location (including street and city if local pharmacy) is medication to be sent to? [id number] - [place], [place] - [address] [place] [address]  3. Do they need a 30 day or 90 day supply?  Patient is requesting enough medication to last him until his appointment on 10/07/20 with Jacolyn Reedy, PA.

## 2020-07-30 ENCOUNTER — Telehealth: Payer: Self-pay | Admitting: Interventional Cardiology

## 2020-07-30 MED ORDER — ENTRESTO 49-51 MG PO TABS: 1.0000 | ORAL_TABLET | Freq: Two times a day (BID) | ORAL | 0 refills | Status: DC

## 2020-07-30 NOTE — Telephone Encounter (Signed)
Pt's medication was sent to pt pharmacy as requested. Confirmation received. °

## 2020-07-30 NOTE — Telephone Encounter (Signed)
*  STAT* If patient is at the pharmacy, call can be transferred to refill team.   1. Which medications need to be refilled? (please list name of each medication and dose if known) sacubitril-valsartan (ENTRESTO) 49-51 MG 2. Which pharmacy/location (including street and city if local pharmacy) is medication to be sent to? [id number] - [place], [place] - [address] [place] [address]  3. Do they need a 30 day or 90 day supply? 90 day supply

## 2020-08-04 DIAGNOSIS — N1831 Chronic kidney disease, stage 3a: Secondary | ICD-10-CM | POA: Diagnosis not present

## 2020-09-09 ENCOUNTER — Ambulatory Visit (INDEPENDENT_AMBULATORY_CARE_PROVIDER_SITE_OTHER): Payer: Medicare Other

## 2020-09-09 DIAGNOSIS — I255 Ischemic cardiomyopathy: Secondary | ICD-10-CM

## 2020-09-09 LAB — CUP PACEART REMOTE DEVICE CHECK
Battery Remaining Longevity: 86 mo
Battery Voltage: 2.98 V
Brady Statistic AP VP Percent: 10.63 %
Brady Statistic AP VS Percent: 0.02 %
Brady Statistic AS VP Percent: 80.58 %
Brady Statistic AS VS Percent: 8.77 %
Brady Statistic RA Percent Paced: 13.43 %
Brady Statistic RV Percent Paced: 91.21 %
Date Time Interrogation Session: 20220615012318
Implantable Lead Implant Date: 20190614
Implantable Lead Implant Date: 20190614
Implantable Lead Implant Date: 20200213
Implantable Lead Implant Date: 20200213
Implantable Lead Location: 753858
Implantable Lead Location: 753858
Implantable Lead Location: 753859
Implantable Lead Location: 753860
Implantable Lead Model: 3830
Implantable Lead Model: 5071
Implantable Lead Model: 5071
Implantable Lead Model: 5076
Implantable Pulse Generator Implant Date: 20190614
Lead Channel Impedance Value: 323 Ohm
Lead Channel Impedance Value: 323 Ohm
Lead Channel Impedance Value: 399 Ohm
Lead Channel Impedance Value: 456 Ohm
Lead Channel Pacing Threshold Amplitude: 0.625 V
Lead Channel Pacing Threshold Amplitude: 1 V
Lead Channel Pacing Threshold Pulse Width: 0.4 ms
Lead Channel Pacing Threshold Pulse Width: 0.4 ms
Lead Channel Sensing Intrinsic Amplitude: 3.5 mV
Lead Channel Sensing Intrinsic Amplitude: 3.5 mV
Lead Channel Sensing Intrinsic Amplitude: 4.375 mV
Lead Channel Sensing Intrinsic Amplitude: 4.375 mV
Lead Channel Setting Pacing Amplitude: 1.5 V
Lead Channel Setting Pacing Amplitude: 2.5 V
Lead Channel Setting Pacing Pulse Width: 0.6 ms
Lead Channel Setting Sensing Sensitivity: 2 mV

## 2020-09-14 ENCOUNTER — Other Ambulatory Visit: Payer: Self-pay | Admitting: *Deleted

## 2020-09-14 MED ORDER — ATORVASTATIN CALCIUM 80 MG PO TABS: 80.0000 mg | ORAL_TABLET | Freq: Every day | ORAL | 0 refills | Status: DC

## 2020-09-14 NOTE — Addendum Note (Signed)
Addended by: Burnetta Sabin on: 09/14/2020 07:28 AM   Modules accepted: Orders

## 2020-10-01 NOTE — Progress Notes (Signed)
Remote pacemaker transmission.   
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
## 2020-10-07 ENCOUNTER — Ambulatory Visit: Payer: Medicare Other | Admitting: Physician Assistant

## 2020-10-07 ENCOUNTER — Telehealth: Payer: Self-pay | Admitting: Interventional Cardiology

## 2020-10-07 MED ORDER — ENTRESTO 49-51 MG PO TABS: 1.0000 | ORAL_TABLET | Freq: Two times a day (BID) | ORAL | 1 refills | Status: DC

## 2020-10-07 NOTE — Telephone Encounter (Signed)
Pt's medication was sent to pt's pharmacy as requested. Confirmation received.  °

## 2020-10-07 NOTE — Telephone Encounter (Signed)
*  STAT* If patient is at the pharmacy, call can be transferred to refill team.   1. Which medications need to be refilled? (please list name of each medication and dose if known)  sacubitril-valsartan (ENTRESTO) 49-51 MG  2. Which pharmacy/location (including street and city if local pharmacy) is medication to be sent to? [id number] - [place], [place] - [address] [place] [address] [place] [address]  3. Do they need a 30 day or 90 day supply?  90 day supply  Patient is hoping to have his medication refilled ASAP, as he states he will be completely out soon. He was scheduled for an appointment for today, but had to reschedule because he tested positive for COVID yesterday.

## 2020-10-22 ENCOUNTER — Other Ambulatory Visit: Payer: Self-pay | Admitting: Interventional Cardiology

## 2020-11-05 ENCOUNTER — Other Ambulatory Visit: Payer: Self-pay | Admitting: Interventional Cardiology

## 2020-12-08 ENCOUNTER — Other Ambulatory Visit: Payer: Self-pay | Admitting: Internal Medicine

## 2020-12-09 ENCOUNTER — Ambulatory Visit (INDEPENDENT_AMBULATORY_CARE_PROVIDER_SITE_OTHER): Payer: Medicare Other

## 2020-12-09 DIAGNOSIS — I442 Atrioventricular block, complete: Secondary | ICD-10-CM | POA: Diagnosis not present

## 2020-12-11 LAB — CUP PACEART REMOTE DEVICE CHECK
Battery Remaining Longevity: 82 mo
Battery Voltage: 2.97 V
Brady Statistic AP VP Percent: 13.27 %
Brady Statistic AP VS Percent: 0.01 %
Brady Statistic AS VP Percent: 79.83 %
Brady Statistic AS VS Percent: 6.88 %
Brady Statistic RA Percent Paced: 15.67 %
Brady Statistic RV Percent Paced: 93.11 %
Date Time Interrogation Session: 20220916011033
Implantable Lead Implant Date: 20190614
Implantable Lead Implant Date: 20190614
Implantable Lead Implant Date: 20200213
Implantable Lead Implant Date: 20200213
Implantable Lead Location: 753858
Implantable Lead Location: 753858
Implantable Lead Location: 753859
Implantable Lead Location: 753860
Implantable Lead Model: 3830
Implantable Lead Model: 5071
Implantable Lead Model: 5071
Implantable Lead Model: 5076
Implantable Pulse Generator Implant Date: 20190614
Lead Channel Impedance Value: 304 Ohm
Lead Channel Impedance Value: 323 Ohm
Lead Channel Impedance Value: 437 Ohm
Lead Channel Impedance Value: 456 Ohm
Lead Channel Pacing Threshold Amplitude: 0.75 V
Lead Channel Pacing Threshold Amplitude: 0.875 V
Lead Channel Pacing Threshold Pulse Width: 0.4 ms
Lead Channel Pacing Threshold Pulse Width: 0.4 ms
Lead Channel Sensing Intrinsic Amplitude: 4 mV
Lead Channel Sensing Intrinsic Amplitude: 4 mV
Lead Channel Sensing Intrinsic Amplitude: 4.125 mV
Lead Channel Sensing Intrinsic Amplitude: 4.125 mV
Lead Channel Setting Pacing Amplitude: 1.5 V
Lead Channel Setting Pacing Amplitude: 2.5 V
Lead Channel Setting Pacing Pulse Width: 0.6 ms
Lead Channel Setting Sensing Sensitivity: 2 mV

## 2020-12-16 NOTE — Progress Notes (Signed)
Remote pacemaker transmission.   

## 2021-01-12 DIAGNOSIS — N1831 Chronic kidney disease, stage 3a: Secondary | ICD-10-CM | POA: Diagnosis not present

## 2021-01-12 DIAGNOSIS — I5022 Chronic systolic (congestive) heart failure: Secondary | ICD-10-CM | POA: Diagnosis not present

## 2021-01-12 DIAGNOSIS — I129 Hypertensive chronic kidney disease with stage 1 through stage 4 chronic kidney disease, or unspecified chronic kidney disease: Secondary | ICD-10-CM | POA: Diagnosis not present

## 2021-01-12 DIAGNOSIS — Z23 Encounter for immunization: Secondary | ICD-10-CM | POA: Diagnosis not present

## 2021-01-22 ENCOUNTER — Other Ambulatory Visit: Payer: Self-pay | Admitting: Interventional Cardiology
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
## 2021-02-02 ENCOUNTER — Other Ambulatory Visit: Payer: Self-pay

## 2021-02-02 ENCOUNTER — Ambulatory Visit: Payer: Medicare Other | Admitting: Physician Assistant

## 2021-02-02 ENCOUNTER — Encounter: Payer: Self-pay | Admitting: Physician Assistant

## 2021-02-02 VITALS — BP 120/90 | HR 74 | Ht 70.0 in | Wt 196.0 lb

## 2021-02-02 DIAGNOSIS — N1831 Chronic kidney disease, stage 3a: Secondary | ICD-10-CM | POA: Diagnosis not present

## 2021-02-02 DIAGNOSIS — Z951 Presence of aortocoronary bypass graft: Secondary | ICD-10-CM | POA: Diagnosis not present

## 2021-02-02 DIAGNOSIS — I442 Atrioventricular block, complete: Secondary | ICD-10-CM

## 2021-02-02 DIAGNOSIS — E7849 Other hyperlipidemia: Secondary | ICD-10-CM

## 2021-02-02 DIAGNOSIS — I10 Essential (primary) hypertension: Secondary | ICD-10-CM | POA: Diagnosis not present

## 2021-02-02 DIAGNOSIS — I255 Ischemic cardiomyopathy: Secondary | ICD-10-CM | POA: Diagnosis not present

## 2021-02-02 NOTE — Patient Instructions (Signed)
Medication Instructions:  Your physician recommends that you continue on your current medications as directed. Please refer to the Current Medication list given to you today.  *If you need a refill on your cardiac medications before your next appointment, please call your pharmacy*   Lab Work: None If you have labs (blood work) drawn today and your tests are completely normal, you will receive your results only by: MyChart Message (if you have MyChart) OR A paper copy in the mail If you have any lab test that is abnormal or we need to change your treatment, we will call you to review the results.   Follow-Up: At [facility], you and your health needs are our priority.  As part of our continuing mission to provide you with exceptional heart care, we have created designated Provider Care Teams.  These Care Teams include your primary Cardiologist (physician) and Advanced Practice Providers (APPs -  Physician Assistants and Nurse Practitioners) who all work together to provide you with the care you need, when you need it.   Your next appointment:   3 month(s) with Lewayne Bunting, MD 6 months with Dr Verdis Prime, MD  Other Instructions Your provider recommends that you maintain 150 minutes per week of moderate aerobic activity.

## 2021-02-09 ENCOUNTER — Other Ambulatory Visit: Payer: Self-pay

## 2021-02-09 MED ORDER — ENTRESTO 49-51 MG PO TABS
ORAL_TABLET | ORAL | 3 refills | Status: DC
Start: 2021-02-09 — End: 2021-05-07

## 2021-03-10 ENCOUNTER — Ambulatory Visit (INDEPENDENT_AMBULATORY_CARE_PROVIDER_SITE_OTHER): Payer: Medicare Other

## 2021-03-10 DIAGNOSIS — I442 Atrioventricular block, complete: Secondary | ICD-10-CM | POA: Diagnosis not present

## 2021-03-11 LAB — CUP PACEART REMOTE DEVICE CHECK
Battery Remaining Longevity: 78 mo
Battery Voltage: 2.97 V
Brady Statistic AP VP Percent: 11.92 %
Brady Statistic AP VS Percent: 0 %
Brady Statistic AS VP Percent: 83.21 %
Brady Statistic AS VS Percent: 4.86 %
Brady Statistic RA Percent Paced: 13.5 %
Brady Statistic RV Percent Paced: 95.13 %
Date Time Interrogation Session: 20221214235157
Implantable Lead Implant Date: 20190614
Implantable Lead Implant Date: 20190614
Implantable Lead Implant Date: 20200213
Implantable Lead Implant Date: 20200213
Implantable Lead Location: 753858
Implantable Lead Location: 753858
Implantable Lead Location: 753859
Implantable Lead Location: 753860
Implantable Lead Model: 3830
Implantable Lead Model: 5071
Implantable Lead Model: 5071
Implantable Lead Model: 5076
Implantable Pulse Generator Implant Date: 20190614
Lead Channel Impedance Value: 323 Ohm
Lead Channel Impedance Value: 323 Ohm
Lead Channel Impedance Value: 456 Ohm
Lead Channel Impedance Value: 475 Ohm
Lead Channel Pacing Threshold Amplitude: 0.625 V
Lead Channel Pacing Threshold Amplitude: 1 V
Lead Channel Pacing Threshold Pulse Width: 0.4 ms
Lead Channel Pacing Threshold Pulse Width: 0.4 ms
Lead Channel Sensing Intrinsic Amplitude: 3.75 mV
Lead Channel Sensing Intrinsic Amplitude: 3.75 mV
Lead Channel Sensing Intrinsic Amplitude: 4.625 mV
Lead Channel Sensing Intrinsic Amplitude: 4.625 mV
Lead Channel Setting Pacing Amplitude: 1.5 V
Lead Channel Setting Pacing Amplitude: 2.5 V
Lead Channel Setting Pacing Pulse Width: 0.6 ms
Lead Channel Setting Sensing Sensitivity: 2 mV

## 2021-03-19 NOTE — Progress Notes (Signed)
Remote pacemaker transmission.   

## 2021-05-07 ENCOUNTER — Other Ambulatory Visit: Payer: Self-pay | Admitting: Interventional Cardiology

## 2021-05-18 ENCOUNTER — Other Ambulatory Visit: Payer: Self-pay

## 2021-05-18 ENCOUNTER — Encounter: Payer: Self-pay | Admitting: Internal Medicine

## 2021-05-18 ENCOUNTER — Ambulatory Visit: Payer: Medicare Other | Admitting: Internal Medicine

## 2021-05-18 VITALS — BP 144/68 | HR 90 | Ht 70.0 in | Wt 199.0 lb

## 2021-05-18 DIAGNOSIS — Z950 Presence of cardiac pacemaker: Secondary | ICD-10-CM

## 2021-05-18 DIAGNOSIS — I255 Ischemic cardiomyopathy: Secondary | ICD-10-CM

## 2021-05-18 DIAGNOSIS — I442 Atrioventricular block, complete: Secondary | ICD-10-CM | POA: Diagnosis not present

## 2021-05-18 NOTE — Patient Instructions (Signed)
Medication Instructions:  Your physician recommends that you continue on your current medications as directed. Please refer to the Current Medication list given to you today.  Labwork: None ordered.  Testing/Procedures: None ordered.  Follow-Up: Your physician wants you to follow-up in: one year with Cristopher Peru, MD or one of the following Advanced Practice Providers on your designated Care Team:   Tommye Standard, [place] Legrand Como "Jonni Sanger" Chalmers Cater, [place]  Remote monitoring is used to monitor your Pacemaker from home. This monitoring reduces the number of office visits required to check your device to one time per year. It allows [place] to keep an eye on the functioning of your device to ensure it is working properly. You are scheduled for a device check from home on 06/09/2021. You may send your transmission at any time that day. If you have a wireless device, the transmission will be sent automatically. After your physician reviews your transmission, you will receive a postcard with your next transmission date.  Any Other Special Instructions Will Be Listed Below (If Applicable).  If you need a refill on your cardiac medications before your next appointment, please call your pharmacy.
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
## 2021-05-22 DIAGNOSIS — H40033 Anatomical narrow angle, bilateral: Secondary | ICD-10-CM | POA: Diagnosis not present

## 2021-05-22 DIAGNOSIS — H04123 Dry eye syndrome of bilateral lacrimal glands: Secondary | ICD-10-CM | POA: Diagnosis not present

## 2021-06-07 ENCOUNTER — Other Ambulatory Visit: Payer: Self-pay | Admitting: *Deleted

## 2021-06-07 MED ORDER — CARVEDILOL 6.25 MG PO TABS: ORAL_TABLET | ORAL | 1 refills | Status: DC

## 2021-06-15 ENCOUNTER — Telehealth: Payer: Self-pay

## 2021-06-15 NOTE — Telephone Encounter (Signed)
The patient states his monitor is giving him the 3230 error code. He was at work when he called. I asked him to unplug the monitor and plug it back in when he get home to see if that would help. I also gave him the number to [employer] tech support. ?

## 2021-06-16 ENCOUNTER — Ambulatory Visit (INDEPENDENT_AMBULATORY_CARE_PROVIDER_SITE_OTHER): Payer: Medicare Other

## 2021-06-16 DIAGNOSIS — I442 Atrioventricular block, complete: Secondary | ICD-10-CM

## 2021-06-18 LAB — CUP PACEART REMOTE DEVICE CHECK
Battery Remaining Longevity: 73 mo
Battery Voltage: 2.97 V
Brady Statistic AP VP Percent: 21.18 %
Brady Statistic AP VS Percent: 0.01 %
Brady Statistic AS VP Percent: 66.06 %
Brady Statistic AS VS Percent: 12.75 %
Brady Statistic RA Percent Paced: 26.95 %
Brady Statistic RV Percent Paced: 87.24 %
Date Time Interrogation Session: 20230321190324
Implantable Lead Implant Date: 20190614
Implantable Lead Implant Date: 20190614
Implantable Lead Implant Date: 20200213
Implantable Lead Implant Date: 20200213
Implantable Lead Location: 753858
Implantable Lead Location: 753858
Implantable Lead Location: 753859
Implantable Lead Location: 753860
Implantable Lead Model: 3830
Implantable Lead Model: 5071
Implantable Lead Model: 5071
Implantable Lead Model: 5076
Implantable Pulse Generator Implant Date: 20190614
Lead Channel Impedance Value: 323 Ohm
Lead Channel Impedance Value: 323 Ohm
Lead Channel Impedance Value: 418 Ohm
Lead Channel Impedance Value: 456 Ohm
Lead Channel Pacing Threshold Amplitude: 0.625 V
Lead Channel Pacing Threshold Amplitude: 0.875 V
Lead Channel Pacing Threshold Pulse Width: 0.4 ms
Lead Channel Pacing Threshold Pulse Width: 0.4 ms
Lead Channel Sensing Intrinsic Amplitude: 3.75 mV
Lead Channel Sensing Intrinsic Amplitude: 3.75 mV
Lead Channel Sensing Intrinsic Amplitude: 5.875 mV
Lead Channel Sensing Intrinsic Amplitude: 5.875 mV
Lead Channel Setting Pacing Amplitude: 1.5 V
Lead Channel Setting Pacing Amplitude: 2.5 V
Lead Channel Setting Pacing Pulse Width: 0.6 ms
Lead Channel Setting Sensing Sensitivity: 2 mV

## 2021-06-29 NOTE — Progress Notes (Signed)
Remote pacemaker transmission.   

## 2021-08-02 DIAGNOSIS — G629 Polyneuropathy, unspecified: Secondary | ICD-10-CM | POA: Diagnosis not present

## 2021-08-02 DIAGNOSIS — E782 Mixed hyperlipidemia: Secondary | ICD-10-CM | POA: Diagnosis not present

## 2021-08-02 DIAGNOSIS — I129 Hypertensive chronic kidney disease with stage 1 through stage 4 chronic kidney disease, or unspecified chronic kidney disease: Secondary | ICD-10-CM | POA: Diagnosis not present

## 2021-08-02 DIAGNOSIS — Z0000 Encounter for general adult medical examination without abnormal findings: Secondary | ICD-10-CM | POA: Diagnosis not present

## 2021-08-02 DIAGNOSIS — Z79899 Other long term (current) drug therapy: Secondary | ICD-10-CM | POA: Diagnosis not present

## 2021-08-09 DIAGNOSIS — Z0000 Encounter for general adult medical examination without abnormal findings: Secondary | ICD-10-CM | POA: Diagnosis not present

## 2021-08-26 DIAGNOSIS — H353134 Nonexudative age-related macular degeneration, bilateral, advanced atrophic with subfoveal involvement: Secondary | ICD-10-CM | POA: Diagnosis not present

## 2021-08-26 DIAGNOSIS — H04123 Dry eye syndrome of bilateral lacrimal glands: Secondary | ICD-10-CM | POA: Diagnosis not present

## 2021-08-26 DIAGNOSIS — H02532 Eyelid retraction right lower eyelid: Secondary | ICD-10-CM | POA: Diagnosis not present

## 2021-08-26 DIAGNOSIS — D3131 Benign neoplasm of right choroid: Secondary | ICD-10-CM | POA: Diagnosis not present

## 2021-09-15 ENCOUNTER — Ambulatory Visit (INDEPENDENT_AMBULATORY_CARE_PROVIDER_SITE_OTHER): Payer: Medicare Other

## 2021-09-15 DIAGNOSIS — I442 Atrioventricular block, complete: Secondary | ICD-10-CM | POA: Diagnosis not present

## 2021-09-16 LAB — CUP PACEART REMOTE DEVICE CHECK
Battery Remaining Longevity: 67 mo
Battery Voltage: 2.96 V
Brady Statistic AP VP Percent: 22.13 %
Brady Statistic AP VS Percent: 0.03 %
Brady Statistic AS VP Percent: 69.25 %
Brady Statistic AS VS Percent: 8.6 %
Brady Statistic RA Percent Paced: 26.21 %
Brady Statistic RV Percent Paced: 91.38 %
Date Time Interrogation Session: 20230621010435
Implantable Lead Implant Date: 20190614
Implantable Lead Implant Date: 20190614
Implantable Lead Implant Date: 20200213
Implantable Lead Implant Date: 20200213
Implantable Lead Location: 753858
Implantable Lead Location: 753858
Implantable Lead Location: 753859
Implantable Lead Location: 753860
Implantable Lead Model: 3830
Implantable Lead Model: 5071
Implantable Lead Model: 5071
Implantable Lead Model: 5076
Implantable Pulse Generator Implant Date: 20190614
Lead Channel Impedance Value: 323 Ohm
Lead Channel Impedance Value: 323 Ohm
Lead Channel Impedance Value: 437 Ohm
Lead Channel Impedance Value: 494 Ohm
Lead Channel Pacing Threshold Amplitude: 0.625 V
Lead Channel Pacing Threshold Amplitude: 0.875 V
Lead Channel Pacing Threshold Pulse Width: 0.4 ms
Lead Channel Pacing Threshold Pulse Width: 0.4 ms
Lead Channel Sensing Intrinsic Amplitude: 4.875 mV
Lead Channel Sensing Intrinsic Amplitude: 4.875 mV
Lead Channel Sensing Intrinsic Amplitude: 8.75 mV
Lead Channel Sensing Intrinsic Amplitude: 8.75 mV
Lead Channel Setting Pacing Amplitude: 1.5 V
Lead Channel Setting Pacing Amplitude: 2.5 V
Lead Channel Setting Pacing Pulse Width: 0.6 ms
Lead Channel Setting Sensing Sensitivity: 2 mV

## 2021-09-27 NOTE — Progress Notes (Signed)
Remote pacemaker transmission.   

## 2021-09-29 DIAGNOSIS — H903 Sensorineural hearing loss, bilateral: Secondary | ICD-10-CM | POA: Diagnosis not present

## 2021-10-04 ENCOUNTER — Other Ambulatory Visit: Payer: Self-pay

## 2021-10-04 MED ORDER — ATORVASTATIN CALCIUM 80 MG PO TABS: ORAL_TABLET | ORAL | 1 refills | Status: DC

## 2021-10-05 DIAGNOSIS — H903 Sensorineural hearing loss, bilateral: Secondary | ICD-10-CM | POA: Diagnosis not present

## 2021-12-03 ENCOUNTER — Other Ambulatory Visit: Payer: Self-pay

## 2021-12-03 MED ORDER — CARVEDILOL 6.25 MG PO TABS: ORAL_TABLET | ORAL | 1 refills | Status: DC

## 2021-12-15 ENCOUNTER — Ambulatory Visit (INDEPENDENT_AMBULATORY_CARE_PROVIDER_SITE_OTHER): Payer: Medicare Other

## 2021-12-15 DIAGNOSIS — I442 Atrioventricular block, complete: Secondary | ICD-10-CM | POA: Diagnosis not present

## 2021-12-16 LAB — CUP PACEART REMOTE DEVICE CHECK
Battery Remaining Longevity: 60 mo
Battery Voltage: 2.96 V
Brady Statistic AP VP Percent: 17.74 %
Brady Statistic AP VS Percent: 0.01 %
Brady Statistic AS VP Percent: 77.75 %
Brady Statistic AS VS Percent: 4.5 %
Brady Statistic RA Percent Paced: 19.53 %
Brady Statistic RV Percent Paced: 95.49 %
Date Time Interrogation Session: 20230921005924
Implantable Lead Implant Date: 20190614
Implantable Lead Implant Date: 20190614
Implantable Lead Implant Date: 20200213
Implantable Lead Implant Date: 20200213
Implantable Lead Location: 753858
Implantable Lead Location: 753858
Implantable Lead Location: 753859
Implantable Lead Location: 753860
Implantable Lead Model: 3830
Implantable Lead Model: 5071
Implantable Lead Model: 5071
Implantable Lead Model: 5076
Implantable Pulse Generator Implant Date: 20190614
Lead Channel Impedance Value: 323 Ohm
Lead Channel Impedance Value: 323 Ohm
Lead Channel Impedance Value: 418 Ohm
Lead Channel Impedance Value: 475 Ohm
Lead Channel Pacing Threshold Amplitude: 0.625 V
Lead Channel Pacing Threshold Amplitude: 0.875 V
Lead Channel Pacing Threshold Pulse Width: 0.4 ms
Lead Channel Pacing Threshold Pulse Width: 0.4 ms
Lead Channel Sensing Intrinsic Amplitude: 13.75 mV
Lead Channel Sensing Intrinsic Amplitude: 13.75 mV
Lead Channel Sensing Intrinsic Amplitude: 4 mV
Lead Channel Sensing Intrinsic Amplitude: 4 mV
Lead Channel Setting Pacing Amplitude: 1.5 V
Lead Channel Setting Pacing Amplitude: 2.5 V
Lead Channel Setting Pacing Pulse Width: 0.6 ms
Lead Channel Setting Sensing Sensitivity: 2 mV

## 2021-12-28 NOTE — Progress Notes (Signed)
Remote pacemaker transmission.   

## 2022-03-16 ENCOUNTER — Ambulatory Visit (INDEPENDENT_AMBULATORY_CARE_PROVIDER_SITE_OTHER): Payer: Medicare Other

## 2022-03-16 DIAGNOSIS — I442 Atrioventricular block, complete: Secondary | ICD-10-CM | POA: Diagnosis not present

## 2022-03-16 LAB — CUP PACEART REMOTE DEVICE CHECK
Battery Remaining Longevity: 54 mo
Battery Voltage: 2.95 V
Brady Statistic AP VP Percent: 13.79 %
Brady Statistic AP VS Percent: 0 %
Brady Statistic AS VP Percent: 82.95 %
Brady Statistic AS VS Percent: 3.26 %
Brady Statistic RA Percent Paced: 15.42 %
Brady Statistic RV Percent Paced: 96.74 %
Date Time Interrogation Session: 20231220012429
Implantable Lead Connection Status: 753985
Implantable Lead Connection Status: 753985
Implantable Lead Connection Status: 753985
Implantable Lead Connection Status: 753985
Implantable Lead Implant Date: 20190614
Implantable Lead Implant Date: 20190614
Implantable Lead Implant Date: 20200213
Implantable Lead Implant Date: 20200213
Implantable Lead Location: 753858
Implantable Lead Location: 753858
Implantable Lead Location: 753859
Implantable Lead Location: 753860
Implantable Lead Model: 3830
Implantable Lead Model: 5071
Implantable Lead Model: 5071
Implantable Lead Model: 5076
Implantable Pulse Generator Implant Date: 20190614
Lead Channel Impedance Value: 323 Ohm
Lead Channel Impedance Value: 342 Ohm
Lead Channel Impedance Value: 418 Ohm
Lead Channel Impedance Value: 475 Ohm
Lead Channel Pacing Threshold Amplitude: 0.625 V
Lead Channel Pacing Threshold Amplitude: 0.875 V
Lead Channel Pacing Threshold Pulse Width: 0.4 ms
Lead Channel Pacing Threshold Pulse Width: 0.4 ms
Lead Channel Sensing Intrinsic Amplitude: 4 mV
Lead Channel Sensing Intrinsic Amplitude: 4 mV
Lead Channel Sensing Intrinsic Amplitude: 7.625 mV
Lead Channel Sensing Intrinsic Amplitude: 7.625 mV
Lead Channel Setting Pacing Amplitude: 1.5 V
Lead Channel Setting Pacing Amplitude: 2.5 V
Lead Channel Setting Pacing Pulse Width: 0.6 ms
Lead Channel Setting Sensing Sensitivity: 2 mV
Zone Setting Status: 755011
Zone Setting Status: 755011

## 2022-04-02 ENCOUNTER — Other Ambulatory Visit: Payer: Self-pay | Admitting: Interventional Cardiology

## 2022-04-04 ENCOUNTER — Other Ambulatory Visit: Payer: Self-pay

## 2022-04-04 MED ORDER — ATORVASTATIN CALCIUM 80 MG PO TABS: ORAL_TABLET | ORAL | 0 refills | Status: DC

## 2022-04-11 NOTE — Progress Notes (Signed)
Remote pacemaker transmission.   

## 2022-04-12 ENCOUNTER — Ambulatory Visit: Payer: Medicare Other | Attending: Physician Assistant | Admitting: Physician Assistant

## 2022-04-12 ENCOUNTER — Encounter: Payer: Self-pay | Admitting: Physician Assistant

## 2022-04-12 VITALS — BP 126/70 | HR 78 | Ht 70.0 in | Wt 205.0 lb

## 2022-04-12 DIAGNOSIS — E785 Hyperlipidemia, unspecified: Secondary | ICD-10-CM

## 2022-04-12 DIAGNOSIS — Z950 Presence of cardiac pacemaker: Secondary | ICD-10-CM | POA: Diagnosis not present

## 2022-04-12 DIAGNOSIS — I10 Essential (primary) hypertension: Secondary | ICD-10-CM | POA: Diagnosis not present

## 2022-04-12 DIAGNOSIS — I255 Ischemic cardiomyopathy: Secondary | ICD-10-CM

## 2022-04-12 DIAGNOSIS — Z951 Presence of aortocoronary bypass graft: Secondary | ICD-10-CM

## 2022-04-12 DIAGNOSIS — I442 Atrioventricular block, complete: Secondary | ICD-10-CM

## 2022-04-12 MED ORDER — ENTRESTO 49-51 MG PO TABS: 1.0000 | ORAL_TABLET | Freq: Two times a day (BID) | ORAL | 3 refills | Status: DC

## 2022-04-12 MED ORDER — ALBUTEROL SULFATE HFA 108 (90 BASE) MCG/ACT IN AERS: 2.0000 | INHALATION_SPRAY | Freq: Three times a day (TID) | RESPIRATORY_TRACT | 0 refills | Status: DC

## 2022-04-12 MED ORDER — CARVEDILOL 6.25 MG PO TABS: ORAL_TABLET | ORAL | 3 refills | Status: DC

## 2022-04-12 MED ORDER — AMLODIPINE BESYLATE 10 MG PO TABS: 10.0000 mg | ORAL_TABLET | Freq: Every day | ORAL | 3 refills | Status: DC

## 2022-04-12 MED ORDER — ATORVASTATIN CALCIUM 80 MG PO TABS: ORAL_TABLET | ORAL | 3 refills | Status: DC

## 2022-04-12 MED ORDER — FUROSEMIDE 40 MG PO TABS: 40.0000 mg | ORAL_TABLET | Freq: Two times a day (BID) | ORAL | 3 refills | Status: DC
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
## 2022-04-12 NOTE — Patient Instructions (Addendum)
Medication Instructions:  1.Start albuterol inhaler, 2 puffs every 8 hours as needed for shortness of breath 2.Stop taking losartan *If you need a refill on your cardiac medications before your next appointment, please call your pharmacy*   Lab Work: None If you have labs (blood work) drawn today and your tests are completely normal, you will receive your results only by: [place] (if you have MyChart) OR A paper copy in the mail If you have any lab test that is abnormal or we need to change your treatment, we will call you to review the results.   Follow-Up: At [facility], you and your health needs are our priority.  As part of our continuing mission to provide you with exceptional heart care, we have created designated Provider Care Teams.  These Care Teams include your primary Cardiologist (physician) and Advanced Practice Providers (APPs -  Physician Assistants and Nurse Practitioners) who all work together to provide you with the care you need, when you need it.   Your next appointment:   1 year(s)  Provider:   Werner Lean, MD   Other Instructions Check your blood pressure daily at home, one hour after taking your morning medications and let us know if it is elevated so that we can increase the Entresto.

## 2022-05-11 ENCOUNTER — Other Ambulatory Visit: Payer: Self-pay

## 2022-05-11 DIAGNOSIS — S20212A Contusion of left front wall of thorax, initial encounter: Secondary | ICD-10-CM | POA: Diagnosis not present

## 2022-05-11 DIAGNOSIS — M62838 Other muscle spasm: Secondary | ICD-10-CM | POA: Diagnosis not present

## 2022-05-11 MED ORDER — ENTRESTO 49-51 MG PO TABS: 1.0000 | ORAL_TABLET | Freq: Two times a day (BID) | ORAL | 1 refills | Status: DC

## 2022-06-15 ENCOUNTER — Ambulatory Visit (INDEPENDENT_AMBULATORY_CARE_PROVIDER_SITE_OTHER): Payer: Medicare Other

## 2022-06-15 DIAGNOSIS — I442 Atrioventricular block, complete: Secondary | ICD-10-CM | POA: Diagnosis not present

## 2022-06-16 LAB — CUP PACEART REMOTE DEVICE CHECK
Battery Remaining Longevity: 48 mo
Battery Voltage: 2.95 V
Brady Statistic AP VP Percent: 19.19 %
Brady Statistic AP VS Percent: 0.01 %
Brady Statistic AS VP Percent: 75.68 %
Brady Statistic AS VS Percent: 5.13 %
Brady Statistic RA Percent Paced: 21.9 %
Brady Statistic RV Percent Paced: 94.87 %
Date Time Interrogation Session: 20240320012155
Implantable Lead Connection Status: 753985
Implantable Lead Connection Status: 753985
Implantable Lead Connection Status: 753985
Implantable Lead Connection Status: 753985
Implantable Lead Implant Date: 20190614
Implantable Lead Implant Date: 20190614
Implantable Lead Implant Date: 20200213
Implantable Lead Implant Date: 20200213
Implantable Lead Location: 753858
Implantable Lead Location: 753858
Implantable Lead Location: 753859
Implantable Lead Location: 753860
Implantable Lead Model: 3830
Implantable Lead Model: 5071
Implantable Lead Model: 5071
Implantable Lead Model: 5076
Implantable Pulse Generator Implant Date: 20190614
Lead Channel Impedance Value: 304 Ohm
Lead Channel Impedance Value: 304 Ohm
Lead Channel Impedance Value: 380 Ohm
Lead Channel Impedance Value: 456 Ohm
Lead Channel Pacing Threshold Amplitude: 0.625 V
Lead Channel Pacing Threshold Amplitude: 0.875 V
Lead Channel Pacing Threshold Pulse Width: 0.4 ms
Lead Channel Pacing Threshold Pulse Width: 0.4 ms
Lead Channel Sensing Intrinsic Amplitude: 11 mV
Lead Channel Sensing Intrinsic Amplitude: 11 mV
Lead Channel Sensing Intrinsic Amplitude: 3 mV
Lead Channel Sensing Intrinsic Amplitude: 3 mV
Lead Channel Setting Pacing Amplitude: 1.5 V
Lead Channel Setting Pacing Amplitude: 2.5 V
Lead Channel Setting Pacing Pulse Width: 0.6 ms
Lead Channel Setting Sensing Sensitivity: 2 mV
Zone Setting Status: 755011
Zone Setting Status: 755011

## 2022-07-20 NOTE — Progress Notes (Signed)
Remote pacemaker transmission.   

## 2022-08-08 MED ORDER — AMLODIPINE BESYLATE 10 MG PO TABS: 10.0000 mg | ORAL_TABLET | Freq: Every day | ORAL | 0 refills | Status: DC

## 2022-08-08 NOTE — Addendum Note (Signed)
Addended by: Frutoso Schatz on: 08/08/2022 10:28 AM   Modules accepted: Orders

## 2022-08-29 DIAGNOSIS — D3131 Benign neoplasm of right choroid: Secondary | ICD-10-CM | POA: Diagnosis not present

## 2022-08-29 DIAGNOSIS — H353134 Nonexudative age-related macular degeneration, bilateral, advanced atrophic with subfoveal involvement: Secondary | ICD-10-CM | POA: Diagnosis not present

## 2022-08-29 DIAGNOSIS — H04123 Dry eye syndrome of bilateral lacrimal glands: Secondary | ICD-10-CM | POA: Diagnosis not present

## 2022-08-29 DIAGNOSIS — H02532 Eyelid retraction right lower eyelid: Secondary | ICD-10-CM | POA: Diagnosis not present

## 2022-09-05 DIAGNOSIS — E559 Vitamin D deficiency, unspecified: Secondary | ICD-10-CM | POA: Diagnosis not present

## 2022-09-05 DIAGNOSIS — N522 Drug-induced erectile dysfunction: Secondary | ICD-10-CM | POA: Diagnosis not present

## 2022-09-05 DIAGNOSIS — E782 Mixed hyperlipidemia: Secondary | ICD-10-CM | POA: Diagnosis not present

## 2022-09-05 DIAGNOSIS — I442 Atrioventricular block, complete: Secondary | ICD-10-CM | POA: Diagnosis not present

## 2022-09-05 DIAGNOSIS — Z79899 Other long term (current) drug therapy: Secondary | ICD-10-CM | POA: Diagnosis not present

## 2022-09-05 DIAGNOSIS — Z0000 Encounter for general adult medical examination without abnormal findings: Secondary | ICD-10-CM | POA: Diagnosis not present

## 2022-09-05 DIAGNOSIS — N1831 Chronic kidney disease, stage 3a: Secondary | ICD-10-CM | POA: Diagnosis not present

## 2022-09-05 DIAGNOSIS — I130 Hypertensive heart and chronic kidney disease with heart failure and stage 1 through stage 4 chronic kidney disease, or unspecified chronic kidney disease: Secondary | ICD-10-CM | POA: Diagnosis not present

## 2022-09-05 DIAGNOSIS — I5022 Chronic systolic (congestive) heart failure: Secondary | ICD-10-CM | POA: Diagnosis not present

## 2022-09-05 DIAGNOSIS — I129 Hypertensive chronic kidney disease with stage 1 through stage 4 chronic kidney disease, or unspecified chronic kidney disease: Secondary | ICD-10-CM | POA: Diagnosis not present

## 2022-09-06 DIAGNOSIS — H353134 Nonexudative age-related macular degeneration, bilateral, advanced atrophic with subfoveal involvement: Secondary | ICD-10-CM | POA: Diagnosis not present

## 2022-09-06 DIAGNOSIS — H353113 Nonexudative age-related macular degeneration, right eye, advanced atrophic without subfoveal involvement: Secondary | ICD-10-CM | POA: Diagnosis not present

## 2022-09-06 DIAGNOSIS — G4733 Obstructive sleep apnea (adult) (pediatric): Secondary | ICD-10-CM | POA: Diagnosis not present

## 2022-09-06 DIAGNOSIS — H04123 Dry eye syndrome of bilateral lacrimal glands: Secondary | ICD-10-CM | POA: Diagnosis not present

## 2022-09-14 ENCOUNTER — Ambulatory Visit (INDEPENDENT_AMBULATORY_CARE_PROVIDER_SITE_OTHER): Payer: Medicare Other

## 2022-09-14 DIAGNOSIS — I442 Atrioventricular block, complete: Secondary | ICD-10-CM | POA: Diagnosis not present

## 2022-09-15 LAB — CUP PACEART REMOTE DEVICE CHECK
Battery Remaining Longevity: 42 mo
Battery Voltage: 2.94 V
Brady Statistic AP VP Percent: 30.06 %
Brady Statistic AP VS Percent: 0.01 %
Brady Statistic AS VP Percent: 63.26 %
Brady Statistic AS VS Percent: 6.68 %
Brady Statistic RA Percent Paced: 34.77 %
Brady Statistic RV Percent Paced: 93.31 %
Date Time Interrogation Session: 20240620005732
Implantable Lead Connection Status: 753985
Implantable Lead Connection Status: 753985
Implantable Lead Connection Status: 753985
Implantable Lead Connection Status: 753985
Implantable Lead Implant Date: 20190614
Implantable Lead Implant Date: 20190614
Implantable Lead Implant Date: 20200213
Implantable Lead Implant Date: 20200213
Implantable Lead Location: 753858
Implantable Lead Location: 753858
Implantable Lead Location: 753859
Implantable Lead Location: 753860
Implantable Lead Model: 3830
Implantable Lead Model: 5071
Implantable Lead Model: 5071
Implantable Lead Model: 5076
Implantable Pulse Generator Implant Date: 20190614
Lead Channel Impedance Value: 304 Ohm
Lead Channel Impedance Value: 304 Ohm
Lead Channel Impedance Value: 418 Ohm
Lead Channel Impedance Value: 475 Ohm
Lead Channel Pacing Threshold Amplitude: 0.625 V
Lead Channel Pacing Threshold Amplitude: 0.875 V
Lead Channel Pacing Threshold Pulse Width: 0.4 ms
Lead Channel Pacing Threshold Pulse Width: 0.4 ms
Lead Channel Sensing Intrinsic Amplitude: 11 mV
Lead Channel Sensing Intrinsic Amplitude: 11 mV
Lead Channel Sensing Intrinsic Amplitude: 4.75 mV
Lead Channel Sensing Intrinsic Amplitude: 4.75 mV
Lead Channel Setting Pacing Amplitude: 1.5 V
Lead Channel Setting Pacing Amplitude: 2.5 V
Lead Channel Setting Pacing Pulse Width: 0.6 ms
Lead Channel Setting Sensing Sensitivity: 2 mV
Zone Setting Status: 755011
Zone Setting Status: 755011

## 2022-09-21 DIAGNOSIS — R748 Abnormal levels of other serum enzymes: Secondary | ICD-10-CM | POA: Diagnosis not present

## 2022-09-22 DIAGNOSIS — H04123 Dry eye syndrome of bilateral lacrimal glands: Secondary | ICD-10-CM | POA: Diagnosis not present

## 2022-09-22 DIAGNOSIS — H353124 Nonexudative age-related macular degeneration, left eye, advanced atrophic with subfoveal involvement: Secondary | ICD-10-CM | POA: Diagnosis not present

## 2022-09-22 DIAGNOSIS — H353134 Nonexudative age-related macular degeneration, bilateral, advanced atrophic with subfoveal involvement: Secondary | ICD-10-CM | POA: Diagnosis not present

## 2022-09-22 DIAGNOSIS — H353113 Nonexudative age-related macular degeneration, right eye, advanced atrophic without subfoveal involvement: Secondary | ICD-10-CM | POA: Diagnosis not present

## 2022-09-27 ENCOUNTER — Other Ambulatory Visit: Payer: Self-pay | Admitting: Physician Assistant

## 2022-09-27 NOTE — Telephone Encounter (Signed)
Patient of Dr. Chandrasekhar. Please review for refill. Thank you!  

## 2022-10-20 DIAGNOSIS — H353124 Nonexudative age-related macular degeneration, left eye, advanced atrophic with subfoveal involvement: Secondary | ICD-10-CM | POA: Diagnosis not present

## 2022-10-20 DIAGNOSIS — H04123 Dry eye syndrome of bilateral lacrimal glands: Secondary | ICD-10-CM | POA: Diagnosis not present

## 2022-10-20 DIAGNOSIS — H353134 Nonexudative age-related macular degeneration, bilateral, advanced atrophic with subfoveal involvement: Secondary | ICD-10-CM | POA: Diagnosis not present

## 2022-10-20 DIAGNOSIS — H353113 Nonexudative age-related macular degeneration, right eye, advanced atrophic without subfoveal involvement: Secondary | ICD-10-CM | POA: Diagnosis not present

## 2022-11-01 DIAGNOSIS — R748 Abnormal levels of other serum enzymes: Secondary | ICD-10-CM | POA: Diagnosis not present

## 2022-11-03 DIAGNOSIS — H353124 Nonexudative age-related macular degeneration, left eye, advanced atrophic with subfoveal involvement: Secondary | ICD-10-CM | POA: Diagnosis not present

## 2022-11-03 DIAGNOSIS — H353113 Nonexudative age-related macular degeneration, right eye, advanced atrophic without subfoveal involvement: Secondary | ICD-10-CM | POA: Diagnosis not present

## 2022-11-03 DIAGNOSIS — H04123 Dry eye syndrome of bilateral lacrimal glands: Secondary | ICD-10-CM | POA: Diagnosis not present

## 2022-11-03 DIAGNOSIS — H353134 Nonexudative age-related macular degeneration, bilateral, advanced atrophic with subfoveal involvement: Secondary | ICD-10-CM | POA: Diagnosis not present

## 2022-12-05 DIAGNOSIS — H353134 Nonexudative age-related macular degeneration, bilateral, advanced atrophic with subfoveal involvement: Secondary | ICD-10-CM | POA: Diagnosis not present

## 2022-12-05 DIAGNOSIS — H353124 Nonexudative age-related macular degeneration, left eye, advanced atrophic with subfoveal involvement: Secondary | ICD-10-CM | POA: Diagnosis not present

## 2022-12-05 DIAGNOSIS — H353113 Nonexudative age-related macular degeneration, right eye, advanced atrophic without subfoveal involvement: Secondary | ICD-10-CM | POA: Diagnosis not present

## 2022-12-05 DIAGNOSIS — H04123 Dry eye syndrome of bilateral lacrimal glands: Secondary | ICD-10-CM | POA: Diagnosis not present

## 2022-12-13 DIAGNOSIS — H04123 Dry eye syndrome of bilateral lacrimal glands: Secondary | ICD-10-CM | POA: Diagnosis not present

## 2022-12-13 DIAGNOSIS — H353124 Nonexudative age-related macular degeneration, left eye, advanced atrophic with subfoveal involvement: Secondary | ICD-10-CM | POA: Diagnosis not present

## 2022-12-13 DIAGNOSIS — H353113 Nonexudative age-related macular degeneration, right eye, advanced atrophic without subfoveal involvement: Secondary | ICD-10-CM | POA: Diagnosis not present

## 2022-12-13 DIAGNOSIS — H353134 Nonexudative age-related macular degeneration, bilateral, advanced atrophic with subfoveal involvement: Secondary | ICD-10-CM | POA: Diagnosis not present

## 2022-12-14 ENCOUNTER — Ambulatory Visit (INDEPENDENT_AMBULATORY_CARE_PROVIDER_SITE_OTHER): Payer: Medicare Other

## 2022-12-14 DIAGNOSIS — I442 Atrioventricular block, complete: Secondary | ICD-10-CM | POA: Diagnosis not present

## 2022-12-14 LAB — CUP PACEART REMOTE DEVICE CHECK
Battery Remaining Longevity: 38 mo
Battery Voltage: 2.94 V
Brady Statistic AP VP Percent: 36.6 %
Brady Statistic AP VS Percent: 0.02 %
Brady Statistic AS VP Percent: 51.42 %
Brady Statistic AS VS Percent: 11.96 %
Brady Statistic RA Percent Paced: 44.45 %
Brady Statistic RV Percent Paced: 88.03 %
Date Time Interrogation Session: 20240918045238
Implantable Lead Connection Status: 753985
Implantable Lead Connection Status: 753985
Implantable Lead Connection Status: 753985
Implantable Lead Connection Status: 753985
Implantable Lead Implant Date: 20190614
Implantable Lead Implant Date: 20190614
Implantable Lead Implant Date: 20200213
Implantable Lead Implant Date: 20200213
Implantable Lead Location: 753858
Implantable Lead Location: 753858
Implantable Lead Location: 753859
Implantable Lead Location: 753860
Implantable Lead Model: 3830
Implantable Lead Model: 5071
Implantable Lead Model: 5071
Implantable Lead Model: 5076
Implantable Pulse Generator Implant Date: 20190614
Lead Channel Impedance Value: 304 Ohm
Lead Channel Impedance Value: 304 Ohm
Lead Channel Impedance Value: 399 Ohm
Lead Channel Impedance Value: 475 Ohm
Lead Channel Pacing Threshold Amplitude: 0.625 V
Lead Channel Pacing Threshold Amplitude: 1 V
Lead Channel Pacing Threshold Pulse Width: 0.4 ms
Lead Channel Pacing Threshold Pulse Width: 0.4 ms
Lead Channel Sensing Intrinsic Amplitude: 3.875 mV
Lead Channel Sensing Intrinsic Amplitude: 3.875 mV
Lead Channel Sensing Intrinsic Amplitude: 4.75 mV
Lead Channel Sensing Intrinsic Amplitude: 4.75 mV
Lead Channel Setting Pacing Amplitude: 1.5 V
Lead Channel Setting Pacing Amplitude: 2.5 V
Lead Channel Setting Pacing Pulse Width: 0.6 ms
Lead Channel Setting Sensing Sensitivity: 2 mV
Zone Setting Status: 755011
Zone Setting Status: 755011

## 2022-12-20 NOTE — Progress Notes (Signed)
Remote pacemaker transmission.   

## 2022-12-28 NOTE — Progress Notes (Signed)
Remote pacemaker transmission.   

## 2023-01-23 DIAGNOSIS — H04123 Dry eye syndrome of bilateral lacrimal glands: Secondary | ICD-10-CM | POA: Diagnosis not present

## 2023-01-23 DIAGNOSIS — H353134 Nonexudative age-related macular degeneration, bilateral, advanced atrophic with subfoveal involvement: Secondary | ICD-10-CM | POA: Diagnosis not present

## 2023-01-23 DIAGNOSIS — H353113 Nonexudative age-related macular degeneration, right eye, advanced atrophic without subfoveal involvement: Secondary | ICD-10-CM | POA: Diagnosis not present

## 2023-01-23 DIAGNOSIS — H353124 Nonexudative age-related macular degeneration, left eye, advanced atrophic with subfoveal involvement: Secondary | ICD-10-CM | POA: Diagnosis not present

## 2023-03-06 DIAGNOSIS — H353113 Nonexudative age-related macular degeneration, right eye, advanced atrophic without subfoveal involvement: Secondary | ICD-10-CM | POA: Diagnosis not present

## 2023-03-06 DIAGNOSIS — H353124 Nonexudative age-related macular degeneration, left eye, advanced atrophic with subfoveal involvement: Secondary | ICD-10-CM | POA: Diagnosis not present

## 2023-03-06 DIAGNOSIS — H353134 Nonexudative age-related macular degeneration, bilateral, advanced atrophic with subfoveal involvement: Secondary | ICD-10-CM | POA: Diagnosis not present

## 2023-03-06 DIAGNOSIS — H04123 Dry eye syndrome of bilateral lacrimal glands: Secondary | ICD-10-CM | POA: Diagnosis not present

## 2023-03-08 DIAGNOSIS — I442 Atrioventricular block, complete: Secondary | ICD-10-CM | POA: Diagnosis not present

## 2023-03-08 DIAGNOSIS — I130 Hypertensive heart and chronic kidney disease with heart failure and stage 1 through stage 4 chronic kidney disease, or unspecified chronic kidney disease: Secondary | ICD-10-CM | POA: Diagnosis not present

## 2023-03-08 DIAGNOSIS — N1831 Chronic kidney disease, stage 3a: Secondary | ICD-10-CM | POA: Diagnosis not present

## 2023-03-08 DIAGNOSIS — I5022 Chronic systolic (congestive) heart failure: Secondary | ICD-10-CM | POA: Diagnosis not present

## 2023-03-10 LAB — LAB REPORT - SCANNED: EGFR: 62

## 2023-03-13 ENCOUNTER — Encounter: Payer: Self-pay | Admitting: Internal Medicine

## 2023-03-15 ENCOUNTER — Ambulatory Visit (INDEPENDENT_AMBULATORY_CARE_PROVIDER_SITE_OTHER): Payer: Medicare Other

## 2023-03-15 DIAGNOSIS — I442 Atrioventricular block, complete: Secondary | ICD-10-CM | POA: Diagnosis not present

## 2023-03-15 LAB — CUP PACEART REMOTE DEVICE CHECK
Battery Remaining Longevity: 35 mo
Battery Voltage: 2.93 V
Brady Statistic AP VP Percent: 29.35 %
Brady Statistic AP VS Percent: 0.53 %
Brady Statistic AS VP Percent: 61.42 %
Brady Statistic AS VS Percent: 8.7 %
Brady Statistic RA Percent Paced: 35.18 %
Brady Statistic RV Percent Paced: 90.76 %
Date Time Interrogation Session: 20241218003108
Implantable Lead Connection Status: 753985
Implantable Lead Connection Status: 753985
Implantable Lead Connection Status: 753985
Implantable Lead Connection Status: 753985
Implantable Lead Implant Date: 20190614
Implantable Lead Implant Date: 20190614
Implantable Lead Implant Date: 20200213
Implantable Lead Implant Date: 20200213
Implantable Lead Location: 753858
Implantable Lead Location: 753858
Implantable Lead Location: 753859
Implantable Lead Location: 753860
Implantable Lead Model: 3830
Implantable Lead Model: 5071
Implantable Lead Model: 5071
Implantable Lead Model: 5076
Implantable Pulse Generator Implant Date: 20190614
Lead Channel Impedance Value: 304 Ohm
Lead Channel Impedance Value: 323 Ohm
Lead Channel Impedance Value: 418 Ohm
Lead Channel Impedance Value: 456 Ohm
Lead Channel Pacing Threshold Amplitude: 0.625 V
Lead Channel Pacing Threshold Amplitude: 0.875 V
Lead Channel Pacing Threshold Pulse Width: 0.4 ms
Lead Channel Pacing Threshold Pulse Width: 0.4 ms
Lead Channel Sensing Intrinsic Amplitude: 3.25 mV
Lead Channel Sensing Intrinsic Amplitude: 3.25 mV
Lead Channel Sensing Intrinsic Amplitude: 5.5 mV
Lead Channel Sensing Intrinsic Amplitude: 5.5 mV
Lead Channel Setting Pacing Amplitude: 1.5 V
Lead Channel Setting Pacing Amplitude: 2.5 V
Lead Channel Setting Pacing Pulse Width: 0.6 ms
Lead Channel Setting Sensing Sensitivity: 2 mV
Zone Setting Status: 755011
Zone Setting Status: 755011

## 2023-03-26 ENCOUNTER — Other Ambulatory Visit: Payer: Self-pay | Admitting: Physician Assistant

## 2023-03-29 ENCOUNTER — Other Ambulatory Visit: Payer: Self-pay | Admitting: Physician Assistant

## 2023-04-14 ENCOUNTER — Ambulatory Visit: Payer: Medicare Other | Attending: Internal Medicine | Admitting: Internal Medicine

## 2023-04-14 VITALS — BP 120/68 | HR 65 | Ht 70.0 in | Wt 197.0 lb

## 2023-04-14 DIAGNOSIS — I255 Ischemic cardiomyopathy: Secondary | ICD-10-CM

## 2023-04-14 DIAGNOSIS — Z951 Presence of aortocoronary bypass graft: Secondary | ICD-10-CM | POA: Diagnosis not present

## 2023-04-14 DIAGNOSIS — Z950 Presence of cardiac pacemaker: Secondary | ICD-10-CM

## 2023-04-14 DIAGNOSIS — R0609 Other forms of dyspnea: Secondary | ICD-10-CM

## 2023-04-14 DIAGNOSIS — I10 Essential (primary) hypertension: Secondary | ICD-10-CM | POA: Diagnosis not present

## 2023-04-14 DIAGNOSIS — I442 Atrioventricular block, complete: Secondary | ICD-10-CM | POA: Diagnosis not present

## 2023-04-14 DIAGNOSIS — E785 Hyperlipidemia, unspecified: Secondary | ICD-10-CM

## 2023-04-14 NOTE — Patient Instructions (Signed)
Medication Instructions:  Your physician recommends that you continue on your current medications as directed. Please refer to the Current Medication list given to you today.  *If you need a refill on your cardiac medications before your next appointment, please call your pharmacy*   Lab Work: BMP, BNP  If you have labs (blood work) drawn today and your tests are completely normal, you will receive your results only by: MyChart Message (if you have MyChart) OR A paper copy in the mail If you have any lab test that is abnormal or we need to change your treatment, we will call you to review the results.   Testing/Procedures: Your physician has requested that you have an echocardiogram. Echocardiography is a painless test that uses sound waves to create images of your heart. It provides your doctor with information about the size and shape of your heart and how well your heart's chambers and valves are working. This procedure takes approximately one hour. There are no restrictions for this procedure. Please do NOT wear cologne, perfume, aftershave, or lotions (deodorant is allowed). Please arrive 15 minutes prior to your appointment time.  Please note: We ask at that you not bring children with you during ultrasound (echo/ vascular) testing. Due to room size and safety concerns, children are not allowed in the ultrasound rooms during exams. Our front office staff cannot provide observation of children in our lobby area while testing is being conducted. An adult accompanying a patient to their appointment will only be allowed in the ultrasound room at the discretion of the ultrasound technician under special circumstances. We apologize for any inconvenience.    Follow-Up: At [facility], you and your health needs are our priority.  As part of our continuing mission to provide you with exceptional heart care, we have created designated Provider Care Teams.  These Care Teams include your  primary Cardiologist (physician) and Advanced Practice Providers (APPs -  Physician Assistants and Nurse Practitioners) who all work together to provide you with the care you need, when you need it.   Your next appointment:   1 year(s)  Provider:   Christell Constant, MD

## 2023-04-14 NOTE — Progress Notes (Signed)
Cardiology Office Note:  .    Date:  04/14/2023  ID:  [name], DOB 06-06-1945, MRN [id number] PCP: Emilio Aspen, MD  [facility] Providers Cardiologist:  Christell Constant, MD Electrophysiologist:  Lewayne Bunting, MD     CC: Transition to new cardiologist  History of Present Illness: .    Discussed the use of AI scribe software for clinical note transcription with the patient, who gave verbal consent to proceed.  Brian [name] is a 78 y.o. male [place] with a history of complete heart block, coronary artery disease status post coronary artery bypass graft (CABG), heart failure with mildly reduced ejection fraction, and hypertension, sought to establish care from a clinical cardiology standpoint. He had previously been under the care of Dr. Katrinka Blazing and Dr. Ladona Ridgel for management of his heart block and rare premature ventricular contractions (PVCs). His ejection fraction, previously at 30%, had improved with goal-directed medical therapy and bypass surgery.  In 2020, he experienced a significant health event while at a routine appointment with Dr. Corbin Ade. Despite feeling well and attempting to return to work, his blood pressure readings prompted an immediate hospital admission. This led to the placement of a pacemaker. Subsequently, he experienced further health complications, leading to the decision to add a third lead to his pacemaker during open-heart surgery.  In terms of his coronary artery disease, he has not reported any chest pain. However, he has noticed some breathlessness with exertion, particularly during fast-paced walking. He has been trying to maintain an active lifestyle, aiming for 6000 steps per day and engaging in regular weight lifting exercises. Despite his efforts, he has noticed that he needs to stop and rest more frequently during walks.  His kidney function is not entirely normal, but he has not required dialysis. He has also reported feeling  his pacemaker when sleeping on his left side and has experienced tingling in his fingers. However, these symptoms have not been further explored or explained.  Overall, he has been managing his conditions with his current medications, and his cholesterol and blood pressure levels have been well controlled. 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   Relevant histories: .  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   Studies Reviewed: .   Cardiac Studies & Procedures   CARDIAC CATHETERIZATION  CARDIAC CATHETERIZATION 05/09/2018  Narrative  Severe calcific three-vessel coronary disease involving the nondominant right coronary, the mid circumflex and 2 large obtuse marginal branches, and the mid segment of the LAD and proximal portion of the first diagonal.  Global left ventricular hypokinesis.  Will need to differentiate if this is ischemically mediated versus pacemaker related decline.  The patient has shortness of breath which could be an ischemic equivalent.  Normal pulmonary artery pressures.  RECOMMENDATIONS:   Surgical revascularization should be considered.  Not a good candidate MRI viability study due to indwelling device.  Findings Coronary Findings Diagnostic  Dominance: Left  Left Anterior Descending Ost LAD to Prox LAD lesion is 25% stenosed. Prox LAD to Mid LAD lesion is 80% stenosed.  First Diagonal Branch Ost 1st Diag lesion is 70% stenosed.  Left Circumflex Prox Cx to Mid Cx lesion is 70% stenosed. Mid Cx lesion is 60% stenosed.  First Obtuse Marginal Branch Vessel is small in size.  Second Obtuse Marginal Branch Ost 2nd Mrg lesion is 70% stenosed.  Left Posterior Descending Artery Ost LPDA lesion is 80% stenosed.  First Left Posterolateral Branch Vessel is large in size. 1st  LPL lesion is 80% stenosed.  Right Coronary Artery Prox RCA to Mid RCA lesion is 95% stenosed.  Intervention  No interventions have been documented.    ECHOCARDIOGRAM  ECHOCARDIOGRAM LIMITED 04/17/2019  Narrative ECHOCARDIOGRAM LIMITED REPORT    Patient Name:   Brian [name] Date of Exam: 04/17/2019 Medical Rec #:  [id number]      Height:       70.0 in Accession #:    [id number]     Weight:       208.0 lb Date of Birth:  Feb 13, 1946      BSA:          2.12 m Patient Age:    73 years       BP:           118/84 mmHg Patient Gender: M              HR:           74 bpm. Exam Location:  [address]   Procedure: 2D Echo, Limited Echo, Limited Color Doppler, Cardiac Doppler and Intracardiac Opacification Agent  Indications:    I42.0 Dilated Cardiomyopathy  History:        Patient has prior history of Echocardiogram examinations, most recent 08/21/2018. Coronary artery disease. Chronic systolic heart failure. Chronic kidney disease. Pneumonia.  Sonographer:    NaTashia Rodgers-Jones RDCS Referring Phys: [id number] LORI C GERHARDT  IMPRESSIONS   1. Left ventricular ejection fraction, by visual estimation, is 40 to 45%. The left ventricle has mild to moderately decreased function. There is no increased left ventricular wall thickness. 2. Moderately dilated left ventricular internal cavity size. 3. The left ventricle demonstrates regional wall motion abnormalities. 4. Poor visualization of endocardium even with definity septum and apex seem severely hypokinetic. 5. Global right ventricle has normal systolc function.The right ventricular size is normal. no increase in right ventricular wall thickness. 6. Pacing wires in RA/RV. 7. Left atrial size was mildly dilated. 8. The mitral valve is normal in structure. Trivial mitral valve regurgitation. No evidence of mitral stenosis. 9. The tricuspid valve was normal in structure. Tricuspid valve regurgitation is mild. 10. Tricuspid valve  regurgitation is mild. 11. Mild to moderate aortic valve sclerosis/calcification without any evidence of aortic stenosis. 12. The pulmonic valve was normal in structure. 13. Aortic dilatation noted. 14. There is mild dilatation of the ascending aorta measuring 39 mm. 15. The inferior vena cava is normal in size with greater than 50% respiratory variability, suggesting right atrial pressure of 3 mmHg.  FINDINGS Left Ventricle: Left ventricular ejection fraction, by visual estimation, is 40 to 45%. The left ventricle has mild to moderately decreased function. The left ventricle demonstrates regional wall motion abnormalities. The left ventricular internal cavity size was moderately dilated left ventricle. There is no increased left ventricular wall thickness. Left ventricular diastolic parameters were normal. Normal left atrial pressure. Poor visualization of endocardium even with definity septum and apex seem severely hypokinetic.  Right Ventricle: The right ventricular size is normal. No increase in right ventricular wall thickness. Global RV systolic function is has normal systolic function. Pacing wires in RA/RV.  Left Atrium: Left atrial size was mildly dilated.  Right Atrium: Right atrial size was normal in size. Right atrial pressure is estimated at 10 mmHg.  Pericardium: There  is no evidence of pericardial effusion is seen. There is no evidence of pericardial effusion.  Mitral Valve: The mitral valve is normal in structure. No evidence of mitral valve stenosis by observation. MV Area by PHT, 3.17 cm. MV PHT, 69.31 msec. Trivial mitral valve regurgitation.  Tricuspid Valve: The tricuspid valve is normal in structure. Tricuspid valve regurgitation is mild.  Aortic Valve: The aortic valve is tricuspid. Aortic valve regurgitation is not visualized. Mild to moderate aortic valve sclerosis/calcification is present, without any evidence of aortic stenosis.  Pulmonic Valve: The pulmonic  valve was normal in structure. Pulmonic valve regurgitation is not visualized by color flow Doppler. Pulmonic regurgitation is not visualized by color flow Doppler.  Aorta: The aortic root, ascending aorta and aortic arch are all structurally normal, with no evidence of dilitation or obstruction and aortic dilatation noted. There is mild dilatation of the ascending aorta measuring 39 mm.  Venous: The inferior vena cava is normal in size with greater than 50% respiratory variability, suggesting right atrial pressure of 3 mmHg.  Shunts: There is no evidence of a patent foramen ovale. No ventricular septal defect is seen or detected. There is no evidence of an atrial septal defect. No atrial level shunt detected by color flow Doppler.   LEFT VENTRICLE          Normals PLAX 2D LVIDd:         4.20 cm  3.6 cm   Diastology                 Normals LVIDs:         3.40 cm  1.7 cm   LV e' lateral:   7.29 cm/s 6.42 cm/s LV PW:         0.80 cm  1.4 cm   LV E/e' lateral: 13.3      15.4 LV IVS:        0.90 cm  1.3 cm   LV e' medial:    6.09 cm/s 6.96 cm/s LVOT diam:     2.30 cm  2.0 cm   LV E/e' medial:  15.9      6.96 LV SV:         31 ml    79 ml LV SV Index:   14.27    45 ml/m2 LVOT Area:     4.15 cm 3.14 cm2   LEFT ATRIUM         Index LA diam:    3.80 cm 1.79 cm/m AORTIC VALVE             Normals LVOT Vmax:   67.20 cm/s LVOT Vmean:  40.700 cm/s 75 cm/s LVOT VTI:    0.113 m     25.3 cm  AORTA                 Normals Ao Root diam: 3.80 cm 31 mm Ao Asc diam:  3.90 cm 31 mm  MITRAL VALVE              Normals MV Area (PHT): 3.17 cm              SHUNTS MV PHT:        69.31 msec 55 ms      Systemic VTI:  0.11 m MV Decel Time: 239 msec   187 ms     Systemic Diam: 2.30 cm MV E velocity: 97.00 cm/s  103 cm/s MV A velocity: 117.00 cm/s 70.3 cm/s MV E/A ratio:  0.83  1.5   Charlton Haws MD Electronically signed by Charlton Haws MD Signature Date/Time: 04/17/2019/5:27:46 PMThe mitral valve  is normal in structure.    Final  TEE  ECHO TEE 05/10/2018  Interpretation Summary  Septum: No Patent Foramen Ovale present.  Left atrium: Patent foramen ovale not present.  Left atrium: No spontaneous echo contrast.  Aortic valve: The valve is trileaflet. Mild valve thickening present. Mild valve calcification present. Moderately decreased leaflet separation. No stenosis. No regurgitation. No AV vegetation. No evidence of papillary fibroelastoma.  Mitral valve: No leaflet thickening and calcification present. Mild to moderate regurgitation.  Right ventricle: Normal cavity size, wall thickness and ejection fraction. No thrombus present. No mass present.  Pulmonic valve: Trace regurgitation.             Physical Exam:    VS:  BP 120/68 (BP Location: Right Arm)   Pulse 65   Ht 5\' 10"  (1.778 m)   Wt 197 lb (89.4 kg)   SpO2 96%   BMI 28.27 kg/m    Wt Readings from Last 3 Encounters:  04/14/23 197 lb (89.4 kg)  04/12/22 205 lb (93 kg)  05/18/21 199 lb (90.3 kg)    Gen: no distress   Neck: No JVD, No HJR Cardiac: No Rubs or Gallops, no murmur, RRR +2 radial Respiratory: Clear to auscultation bilaterally, normal effort, normal  respiratory rate GI: Soft, nontender, non-distended  MS: No pitting edema;  moves all extremities Integument: Skin feels warm Neuro:  At time of evaluation, alert and oriented to person/place/time/situation  Psych: Normal affect, patient feels well   ASSESSMENT AND PLAN: .    Heart Failure with Reduced Ejection Fraction Chronic heart failure with reduced ejection fraction, previously 30%, now improved with goal-directed medical therapy and bypass surgery. Currently experiencing dyspnea on exertion. Discussed potential addition of SGLT2 inhibitors for mortality benefit and delay of end-stage renal disease, with risks including increased urinary tract infections. Plan to evaluate current heart function and fluid status. - Order echocardiogram -  Order BMP and BNP - Consider SGLT2 inhibitor if heart function has not recovered or if fluid overload is present  Coronary Artery Disease (CAD) Coronary artery disease status post coronary artery bypass graft (CABG). No current chest pain. LDL cholesterol is well-controlled at 34 mg/dL. Blood pressure is well-controlled. Discussed longevity of saphenous vein grafts and potential for future blockages. - Continue current medications  Complete Heart Block Complete heart block managed with a pacemaker. Pacing well in both atria and ventricles. No current issues with pacemaker function. - Rare PVC, sees Dr. Ladona Ridgel, BiV mgmt as per EP  Chronic Kidney Disease (CKD) Chronic kidney disease with suboptimal kidney function. No immediate need for dialysis but requires monitoring. Discussed balance between managing fluid levels and avoiding dialysis. - Monitor kidney function with BMP and BNP during echocardiogram visit  General Health Maintenance Advised to increase physical activity to improve cardiovascular health. Currently walks 6,000 steps a day and uses hand weights. Discussed benefits of dedicated cardiovascular exercise, including mortality and longevity benefits. - Encourage more dedicated cardiovascular exercise - Monitor for any chest pain or discomfort during increased activity  Follow-up - Schedule echocardiogram and lab work - Follow-up in one year if tests are normal.   Riley Lam, MD FASE [facility] Cardiologist [facility]  [address] [place], [place] [address] [phone]  5:29 PM

## 2023-04-21 NOTE — Progress Notes (Signed)
Remote pacemaker transmission.

## 2023-05-01 DIAGNOSIS — H353124 Nonexudative age-related macular degeneration, left eye, advanced atrophic with subfoveal involvement: Secondary | ICD-10-CM | POA: Diagnosis not present

## 2023-05-01 DIAGNOSIS — H353113 Nonexudative age-related macular degeneration, right eye, advanced atrophic without subfoveal involvement: Secondary | ICD-10-CM | POA: Diagnosis not present

## 2023-05-01 DIAGNOSIS — H353134 Nonexudative age-related macular degeneration, bilateral, advanced atrophic with subfoveal involvement: Secondary | ICD-10-CM | POA: Diagnosis not present

## 2023-05-01 DIAGNOSIS — H04123 Dry eye syndrome of bilateral lacrimal glands: Secondary | ICD-10-CM | POA: Diagnosis not present

## 2023-05-03 ENCOUNTER — Ambulatory Visit (HOSPITAL_COMMUNITY): Payer: Medicare Other | Attending: Internal Medicine

## 2023-05-03 DIAGNOSIS — R0609 Other forms of dyspnea: Secondary | ICD-10-CM

## 2023-05-03 LAB — ECHOCARDIOGRAM COMPLETE
Area-P 1/2: 3.17 cm2
Est EF: 45
S' Lateral: 3 cm

## 2023-05-03 MED ORDER — PERFLUTREN LIPID MICROSPHERE
1.0000 mL | INTRAVENOUS | Status: AC | PRN
  Administered 2023-05-03: 2 mL via INTRAVENOUS

## 2023-05-04 LAB — BASIC METABOLIC PANEL WITH GFR
BUN/Creatinine Ratio: 13 (ref 10–24)
BUN: 18 mg/dL (ref 8–27)
CO2: 27 mmol/L (ref 20–29)
Calcium: 9.2 mg/dL (ref 8.6–10.2)
Chloride: 104 mmol/L (ref 96–106)
Creatinine, Ser: 1.35 mg/dL — ABNORMAL HIGH (ref 0.76–1.27)
Glucose: 79 mg/dL (ref 70–99)
Potassium: 4.4 mmol/L (ref 3.5–5.2)
Sodium: 148 mmol/L — ABNORMAL HIGH (ref 134–144)
eGFR: 54 mL/min/1.73 — ABNORMAL LOW

## 2023-05-04 LAB — PRO B NATRIURETIC PEPTIDE: NT-Pro BNP: 149 pg/mL (ref 0–486)

## 2023-05-06 ENCOUNTER — Encounter: Payer: Self-pay | Admitting: Internal Medicine

## 2023-05-15 DIAGNOSIS — Z961 Presence of intraocular lens: Secondary | ICD-10-CM | POA: Diagnosis not present

## 2023-05-15 DIAGNOSIS — H43813 Vitreous degeneration, bilateral: Secondary | ICD-10-CM | POA: Diagnosis not present

## 2023-05-15 DIAGNOSIS — H353134 Nonexudative age-related macular degeneration, bilateral, advanced atrophic with subfoveal involvement: Secondary | ICD-10-CM | POA: Diagnosis not present

## 2023-05-22 ENCOUNTER — Encounter: Payer: Self-pay | Admitting: Internal Medicine

## 2023-05-22 ENCOUNTER — Ambulatory Visit: Payer: Medicare Other | Attending: Internal Medicine | Admitting: Internal Medicine

## 2023-05-22 VITALS — BP 112/68 | HR 67 | Ht 70.0 in | Wt 198.0 lb

## 2023-05-22 DIAGNOSIS — I442 Atrioventricular block, complete: Secondary | ICD-10-CM | POA: Diagnosis not present

## 2023-05-22 NOTE — Patient Instructions (Addendum)
 Medication Instructions:  Your physician recommends that you continue on your current medications as directed. Please refer to the Current Medication list given to you today.  *If you need a refill on your cardiac medications before your next appointment, please call your pharmacy*  Lab Work: None ordered.  You may go to any [facility] Location for your lab work:  [facility] - [address] ([facility]) - [address] [phone] [address]  [place] - [address]   [place]  - [address]   [place] - [address] - [address] [employer]  - [address] - [address] ([facility]   If you have labs (blood work) drawn today and your tests are completely normal, you will receive your results only by: [employer] (if you have MyChart)  If you have any lab test that is abnormal or we need to change your treatment, we will call you or send a MyChart message to review the results.  Testing/Procedures: None ordered.  Follow-Up: At [facility], you and your health needs are our priority.  As part of our continuing mission to provide you with exceptional heart care, we have created designated Provider Care Teams.  These Care Teams include your primary Cardiologist (physician) and Advanced Practice Providers (APPs -  Physician Assistants and Nurse Practitioners) who all work together to provide you with the care you need, when you need it.   Your next appointment:   1 year(s)  The format for your next appointment:   In Person  Provider:   Dr Virl Son one of the following Advanced Practice Providers on your designated Care Team:   Francis Dowse, PA-C Casimiro Needle "Mardelle Matte" [place], [place] Earnest Rosier, NP  Note: Remote monitoring is used to monitor your Pacemaker/ ICD from home. This monitoring reduces the number of office visits required to check your  device to one time per year. It allows [place] to keep an eye on the functioning of your device to ensure it is working properly.            Valet parking services will be available as well.
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
## 2023-05-23 LAB — CUP PACEART INCLINIC DEVICE CHECK
Battery Remaining Longevity: 33 mo
Battery Voltage: 2.93 V
Brady Statistic AP VP Percent: 23.82 %
Brady Statistic AP VS Percent: 0.1 %
Brady Statistic AS VP Percent: 68.92 %
Brady Statistic AS VS Percent: 7.16 %
Brady Statistic RA Percent Paced: 27.96 %
Brady Statistic RV Percent Paced: 92.74 %
Date Time Interrogation Session: 20250224161200
Implantable Lead Connection Status: 753985
Implantable Lead Connection Status: 753985
Implantable Lead Connection Status: 753985
Implantable Lead Connection Status: 753985
Implantable Lead Implant Date: 20190614
Implantable Lead Implant Date: 20190614
Implantable Lead Implant Date: 20200213
Implantable Lead Implant Date: 20200213
Implantable Lead Location: 753858
Implantable Lead Location: 753858
Implantable Lead Location: 753859
Implantable Lead Location: 753860
Implantable Lead Model: 3830
Implantable Lead Model: 5071
Implantable Lead Model: 5071
Implantable Lead Model: 5076
Implantable Pulse Generator Implant Date: 20190614
Lead Channel Impedance Value: 342 Ohm
Lead Channel Impedance Value: 361 Ohm
Lead Channel Impedance Value: 456 Ohm
Lead Channel Impedance Value: 513 Ohm
Lead Channel Pacing Threshold Amplitude: 0.5 V
Lead Channel Pacing Threshold Amplitude: 0.625 V
Lead Channel Pacing Threshold Amplitude: 0.875 V
Lead Channel Pacing Threshold Amplitude: 1 V
Lead Channel Pacing Threshold Pulse Width: 0.4 ms
Lead Channel Pacing Threshold Pulse Width: 0.4 ms
Lead Channel Pacing Threshold Pulse Width: 0.4 ms
Lead Channel Pacing Threshold Pulse Width: 0.6 ms
Lead Channel Sensing Intrinsic Amplitude: 4 mV
Lead Channel Sensing Intrinsic Amplitude: 4.375 mV
Lead Channel Sensing Intrinsic Amplitude: 5 mV
Lead Channel Sensing Intrinsic Amplitude: 5.125 mV
Lead Channel Setting Pacing Amplitude: 1.5 V
Lead Channel Setting Pacing Amplitude: 2.5 V
Lead Channel Setting Pacing Pulse Width: 0.6 ms
Lead Channel Setting Sensing Sensitivity: 2 mV
Zone Setting Status: 755011
Zone Setting Status: 755011

## 2023-05-29 DIAGNOSIS — H353134 Nonexudative age-related macular degeneration, bilateral, advanced atrophic with subfoveal involvement: Secondary | ICD-10-CM | POA: Diagnosis not present

## 2023-06-14 ENCOUNTER — Ambulatory Visit (INDEPENDENT_AMBULATORY_CARE_PROVIDER_SITE_OTHER): Payer: Medicare Other

## 2023-06-14 DIAGNOSIS — I442 Atrioventricular block, complete: Secondary | ICD-10-CM | POA: Diagnosis not present

## 2023-06-15 LAB — CUP PACEART REMOTE DEVICE CHECK
Battery Remaining Longevity: 32 mo
Battery Voltage: 2.93 V
Brady Statistic AP VP Percent: 23.41 %
Brady Statistic AP VS Percent: 0.33 %
Brady Statistic AS VP Percent: 68.46 %
Brady Statistic AS VS Percent: 7.8 %
Brady Statistic RA Percent Paced: 28.19 %
Brady Statistic RV Percent Paced: 91.87 %
Date Time Interrogation Session: 20250319015240
Implantable Lead Connection Status: 753985
Implantable Lead Connection Status: 753985
Implantable Lead Connection Status: 753985
Implantable Lead Connection Status: 753985
Implantable Lead Implant Date: 20190614
Implantable Lead Implant Date: 20190614
Implantable Lead Implant Date: 20200213
Implantable Lead Implant Date: 20200213
Implantable Lead Location: 753858
Implantable Lead Location: 753858
Implantable Lead Location: 753859
Implantable Lead Location: 753860
Implantable Lead Model: 3830
Implantable Lead Model: 5071
Implantable Lead Model: 5071
Implantable Lead Model: 5076
Implantable Pulse Generator Implant Date: 20190614
Lead Channel Impedance Value: 304 Ohm
Lead Channel Impedance Value: 323 Ohm
Lead Channel Impedance Value: 418 Ohm
Lead Channel Impedance Value: 456 Ohm
Lead Channel Pacing Threshold Amplitude: 0.625 V
Lead Channel Pacing Threshold Amplitude: 1 V
Lead Channel Pacing Threshold Pulse Width: 0.4 ms
Lead Channel Pacing Threshold Pulse Width: 0.4 ms
Lead Channel Sensing Intrinsic Amplitude: 4.375 mV
Lead Channel Sensing Intrinsic Amplitude: 5 mV
Lead Channel Sensing Intrinsic Amplitude: 5 mV
Lead Channel Sensing Intrinsic Amplitude: 5 mV
Lead Channel Setting Pacing Amplitude: 1.5 V
Lead Channel Setting Pacing Amplitude: 2.5 V
Lead Channel Setting Pacing Pulse Width: 0.6 ms
Lead Channel Setting Sensing Sensitivity: 2 mV
Zone Setting Status: 755011
Zone Setting Status: 755011

## 2023-06-19 ENCOUNTER — Encounter: Payer: Self-pay | Admitting: Internal Medicine

## 2023-06-24 ENCOUNTER — Other Ambulatory Visit: Payer: Self-pay | Admitting: Internal Medicine

## 2023-06-26 DIAGNOSIS — Z961 Presence of intraocular lens: Secondary | ICD-10-CM | POA: Diagnosis not present

## 2023-06-26 DIAGNOSIS — H43813 Vitreous degeneration, bilateral: Secondary | ICD-10-CM | POA: Diagnosis not present

## 2023-06-26 DIAGNOSIS — H353134 Nonexudative age-related macular degeneration, bilateral, advanced atrophic with subfoveal involvement: Secondary | ICD-10-CM | POA: Diagnosis not present

## 2023-07-24 DIAGNOSIS — H43813 Vitreous degeneration, bilateral: Secondary | ICD-10-CM | POA: Diagnosis not present

## 2023-07-24 DIAGNOSIS — Z961 Presence of intraocular lens: Secondary | ICD-10-CM | POA: Diagnosis not present

## 2023-07-24 DIAGNOSIS — H353134 Nonexudative age-related macular degeneration, bilateral, advanced atrophic with subfoveal involvement: Secondary | ICD-10-CM | POA: Diagnosis not present

## 2023-07-28 NOTE — Addendum Note (Signed)
 Addended by: Lott Rouleau A on: 07/28/2023 11:39 AM   Modules accepted: Orders

## 2023-07-28 NOTE — Progress Notes (Signed)
 Remote pacemaker transmission.

## 2023-08-02 ENCOUNTER — Other Ambulatory Visit: Payer: Self-pay

## 2023-08-02 MED ORDER — ENTRESTO 49-51 MG PO TABS: 1.0000 | ORAL_TABLET | Freq: Two times a day (BID) | ORAL | 2 refills | Status: AC

## 2023-08-23 DIAGNOSIS — Z961 Presence of intraocular lens: Secondary | ICD-10-CM | POA: Diagnosis not present

## 2023-08-23 DIAGNOSIS — H43813 Vitreous degeneration, bilateral: Secondary | ICD-10-CM | POA: Diagnosis not present

## 2023-08-23 DIAGNOSIS — H353134 Nonexudative age-related macular degeneration, bilateral, advanced atrophic with subfoveal involvement: Secondary | ICD-10-CM | POA: Diagnosis not present

## 2023-09-01 ENCOUNTER — Other Ambulatory Visit: Payer: Self-pay | Admitting: Physician Assistant

## 2023-09-04 NOTE — Telephone Encounter (Signed)
 Patient is calling regarding refills due to being out. He is requesting 90 day supplies.

## 2023-09-06 DIAGNOSIS — Z79899 Other long term (current) drug therapy: Secondary | ICD-10-CM | POA: Diagnosis not present

## 2023-09-06 DIAGNOSIS — N1831 Chronic kidney disease, stage 3a: Secondary | ICD-10-CM | POA: Diagnosis not present

## 2023-09-06 DIAGNOSIS — Z0000 Encounter for general adult medical examination without abnormal findings: Secondary | ICD-10-CM | POA: Diagnosis not present

## 2023-09-06 DIAGNOSIS — E559 Vitamin D deficiency, unspecified: Secondary | ICD-10-CM | POA: Diagnosis not present

## 2023-09-06 DIAGNOSIS — G629 Polyneuropathy, unspecified: Secondary | ICD-10-CM | POA: Diagnosis not present

## 2023-09-06 DIAGNOSIS — E782 Mixed hyperlipidemia: Secondary | ICD-10-CM | POA: Diagnosis not present

## 2023-09-06 DIAGNOSIS — R739 Hyperglycemia, unspecified: Secondary | ICD-10-CM | POA: Diagnosis not present

## 2023-09-13 ENCOUNTER — Ambulatory Visit (INDEPENDENT_AMBULATORY_CARE_PROVIDER_SITE_OTHER): Payer: Medicare Other

## 2023-09-13 DIAGNOSIS — I442 Atrioventricular block, complete: Secondary | ICD-10-CM | POA: Diagnosis not present

## 2023-09-13 LAB — CUP PACEART REMOTE DEVICE CHECK
Battery Remaining Longevity: 28 mo
Battery Voltage: 2.91 V
Brady Statistic AP VP Percent: 29.28 %
Brady Statistic AP VS Percent: 0.39 %
Brady Statistic AS VP Percent: 62.47 %
Brady Statistic AS VS Percent: 7.86 %
Brady Statistic RA Percent Paced: 34.53 %
Brady Statistic RV Percent Paced: 91.75 %
Date Time Interrogation Session: 20250618011038
Implantable Lead Connection Status: 753985
Implantable Lead Connection Status: 753985
Implantable Lead Connection Status: 753985
Implantable Lead Connection Status: 753985
Implantable Lead Implant Date: 20190614
Implantable Lead Implant Date: 20190614
Implantable Lead Implant Date: 20200213
Implantable Lead Implant Date: 20200213
Implantable Lead Location: 753858
Implantable Lead Location: 753858
Implantable Lead Location: 753859
Implantable Lead Location: 753860
Implantable Lead Model: 3830
Implantable Lead Model: 5071
Implantable Lead Model: 5071
Implantable Lead Model: 5076
Implantable Pulse Generator Implant Date: 20190614
Lead Channel Impedance Value: 323 Ohm
Lead Channel Impedance Value: 323 Ohm
Lead Channel Impedance Value: 418 Ohm
Lead Channel Impedance Value: 475 Ohm
Lead Channel Pacing Threshold Amplitude: 0.75 V
Lead Channel Pacing Threshold Amplitude: 1 V
Lead Channel Pacing Threshold Pulse Width: 0.4 ms
Lead Channel Pacing Threshold Pulse Width: 0.4 ms
Lead Channel Sensing Intrinsic Amplitude: 5.125 mV
Lead Channel Sensing Intrinsic Amplitude: 5.125 mV
Lead Channel Sensing Intrinsic Amplitude: 5.25 mV
Lead Channel Sensing Intrinsic Amplitude: 5.25 mV
Lead Channel Setting Pacing Amplitude: 1.5 V
Lead Channel Setting Pacing Amplitude: 2.5 V
Lead Channel Setting Pacing Pulse Width: 0.6 ms
Lead Channel Setting Sensing Sensitivity: 2 mV
Zone Setting Status: 755011
Zone Setting Status: 755011

## 2023-09-14 ENCOUNTER — Ambulatory Visit: Payer: Self-pay | Admitting: Internal Medicine

## 2023-09-20 DIAGNOSIS — H43813 Vitreous degeneration, bilateral: Secondary | ICD-10-CM | POA: Diagnosis not present

## 2023-09-20 DIAGNOSIS — H353134 Nonexudative age-related macular degeneration, bilateral, advanced atrophic with subfoveal involvement: Secondary | ICD-10-CM | POA: Diagnosis not present

## 2023-09-20 DIAGNOSIS — D3131 Benign neoplasm of right choroid: Secondary | ICD-10-CM | POA: Diagnosis not present

## 2023-09-20 DIAGNOSIS — Z961 Presence of intraocular lens: Secondary | ICD-10-CM | POA: Diagnosis not present

## 2023-10-18 DIAGNOSIS — R748 Abnormal levels of other serum enzymes: Secondary | ICD-10-CM | POA: Diagnosis not present

## 2023-10-25 DIAGNOSIS — H43813 Vitreous degeneration, bilateral: Secondary | ICD-10-CM | POA: Diagnosis not present

## 2023-10-25 DIAGNOSIS — Z961 Presence of intraocular lens: Secondary | ICD-10-CM | POA: Diagnosis not present

## 2023-10-25 DIAGNOSIS — H353134 Nonexudative age-related macular degeneration, bilateral, advanced atrophic with subfoveal involvement: Secondary | ICD-10-CM | POA: Diagnosis not present

## 2023-10-25 DIAGNOSIS — D3131 Benign neoplasm of right choroid: Secondary | ICD-10-CM | POA: Diagnosis not present

## 2023-11-22 DIAGNOSIS — D3131 Benign neoplasm of right choroid: Secondary | ICD-10-CM | POA: Diagnosis not present

## 2023-11-22 DIAGNOSIS — Z961 Presence of intraocular lens: Secondary | ICD-10-CM | POA: Diagnosis not present

## 2023-11-22 DIAGNOSIS — H43813 Vitreous degeneration, bilateral: Secondary | ICD-10-CM | POA: Diagnosis not present

## 2023-11-22 DIAGNOSIS — H353134 Nonexudative age-related macular degeneration, bilateral, advanced atrophic with subfoveal involvement: Secondary | ICD-10-CM | POA: Diagnosis not present

## 2023-11-23 NOTE — Progress Notes (Signed)
 Remote pacemaker transmission.

## 2023-12-13 ENCOUNTER — Ambulatory Visit (INDEPENDENT_AMBULATORY_CARE_PROVIDER_SITE_OTHER): Payer: Medicare Other

## 2023-12-13 DIAGNOSIS — I442 Atrioventricular block, complete: Secondary | ICD-10-CM | POA: Diagnosis not present

## 2023-12-14 LAB — CUP PACEART REMOTE DEVICE CHECK
Battery Remaining Longevity: 27 mo
Battery Voltage: 2.91 V
Brady Statistic AP VP Percent: 27.27 %
Brady Statistic AP VS Percent: 0.25 %
Brady Statistic AS VP Percent: 66 %
Brady Statistic AS VS Percent: 6.48 %
Brady Statistic RA Percent Paced: 31.47 %
Brady Statistic RV Percent Paced: 93.28 %
Date Time Interrogation Session: 20250918010541
Implantable Lead Connection Status: 753985
Implantable Lead Connection Status: 753985
Implantable Lead Connection Status: 753985
Implantable Lead Connection Status: 753985
Implantable Lead Implant Date: 20190614
Implantable Lead Implant Date: 20190614
Implantable Lead Implant Date: 20200213
Implantable Lead Implant Date: 20200213
Implantable Lead Location: 753858
Implantable Lead Location: 753858
Implantable Lead Location: 753859
Implantable Lead Location: 753860
Implantable Lead Model: 3830
Implantable Lead Model: 5071
Implantable Lead Model: 5071
Implantable Lead Model: 5076
Implantable Pulse Generator Implant Date: 20190614
Lead Channel Impedance Value: 304 Ohm
Lead Channel Impedance Value: 323 Ohm
Lead Channel Impedance Value: 418 Ohm
Lead Channel Impedance Value: 456 Ohm
Lead Channel Pacing Threshold Amplitude: 0.75 V
Lead Channel Pacing Threshold Amplitude: 1 V
Lead Channel Pacing Threshold Pulse Width: 0.4 ms
Lead Channel Pacing Threshold Pulse Width: 0.4 ms
Lead Channel Sensing Intrinsic Amplitude: 4.75 mV
Lead Channel Sensing Intrinsic Amplitude: 4.75 mV
Lead Channel Sensing Intrinsic Amplitude: 5.125 mV
Lead Channel Sensing Intrinsic Amplitude: 5.125 mV
Lead Channel Setting Pacing Amplitude: 1.5 V
Lead Channel Setting Pacing Amplitude: 2.5 V
Lead Channel Setting Pacing Pulse Width: 0.6 ms
Lead Channel Setting Sensing Sensitivity: 2 mV
Zone Setting Status: 755011
Zone Setting Status: 755011

## 2023-12-18 NOTE — Progress Notes (Signed)
 Remote PPM Transmission

## 2023-12-20 DIAGNOSIS — Z961 Presence of intraocular lens: Secondary | ICD-10-CM | POA: Diagnosis not present

## 2023-12-20 DIAGNOSIS — D3131 Benign neoplasm of right choroid: Secondary | ICD-10-CM | POA: Diagnosis not present

## 2023-12-20 DIAGNOSIS — H43813 Vitreous degeneration, bilateral: Secondary | ICD-10-CM | POA: Diagnosis not present

## 2023-12-20 DIAGNOSIS — H353134 Nonexudative age-related macular degeneration, bilateral, advanced atrophic with subfoveal involvement: Secondary | ICD-10-CM | POA: Diagnosis not present

## 2023-12-23 ENCOUNTER — Ambulatory Visit: Payer: Self-pay | Admitting: Internal Medicine

## 2024-01-18 DIAGNOSIS — Z961 Presence of intraocular lens: Secondary | ICD-10-CM | POA: Diagnosis not present

## 2024-01-18 DIAGNOSIS — H43813 Vitreous degeneration, bilateral: Secondary | ICD-10-CM | POA: Diagnosis not present

## 2024-01-18 DIAGNOSIS — H353134 Nonexudative age-related macular degeneration, bilateral, advanced atrophic with subfoveal involvement: Secondary | ICD-10-CM | POA: Diagnosis not present

## 2024-01-18 DIAGNOSIS — D3131 Benign neoplasm of right choroid: Secondary | ICD-10-CM | POA: Diagnosis not present

## 2024-01-27 ENCOUNTER — Other Ambulatory Visit: Payer: Self-pay

## 2024-01-27 ENCOUNTER — Encounter (HOSPITAL_BASED_OUTPATIENT_CLINIC_OR_DEPARTMENT_OTHER): Payer: Self-pay

## 2024-01-27 ENCOUNTER — Emergency Department (HOSPITAL_BASED_OUTPATIENT_CLINIC_OR_DEPARTMENT_OTHER)
Admission: EM | Admit: 2024-01-27 | Discharge: 2024-01-27 | Disposition: A | Discharge status: Home / Self Care | Attending: Emergency Medicine | Admitting: Emergency Medicine

## 2024-01-27 DIAGNOSIS — H81399 Other peripheral vertigo, unspecified ear: Secondary | ICD-10-CM | POA: Diagnosis not present

## 2024-01-27 DIAGNOSIS — Z7982 Long term (current) use of aspirin: Secondary | ICD-10-CM | POA: Diagnosis not present

## 2024-01-27 DIAGNOSIS — Z950 Presence of cardiac pacemaker: Secondary | ICD-10-CM | POA: Diagnosis not present

## 2024-01-27 DIAGNOSIS — R42 Dizziness and giddiness: Secondary | ICD-10-CM | POA: Diagnosis not present

## 2024-01-27 LAB — CBC
HCT: 46.4 % (ref 39.0–52.0)
Hemoglobin: 15.9 g/dL (ref 13.0–17.0)
MCH: 31.5 pg (ref 26.0–34.0)
MCHC: 34.3 g/dL (ref 30.0–36.0)
MCV: 91.9 fL (ref 80.0–100.0)
Platelets: 171 K/uL (ref 150–400)
RBC: 5.05 MIL/uL (ref 4.22–5.81)
RDW: 13.4 % (ref 11.5–15.5)
WBC: 6.4 K/uL (ref 4.0–10.5)
nRBC: 0 % (ref 0.0–0.2)

## 2024-01-27 LAB — BASIC METABOLIC PANEL WITH GFR
Anion gap: 10 (ref 5–15)
BUN: 18 mg/dL (ref 8–23)
CO2: 28 mmol/L (ref 22–32)
Calcium: 9.6 mg/dL (ref 8.9–10.3)
Chloride: 101 mmol/L (ref 98–111)
Creatinine, Ser: 1.13 mg/dL (ref 0.61–1.24)
GFR, Estimated: >60 mL/min (ref >60)
Glucose, Bld: 105 mg/dL — ABNORMAL HIGH (ref 70–99)
Potassium: 4.1 mmol/L (ref 3.5–5.1)
Sodium: 139 mmol/L (ref 135–145)

## 2024-01-27 MED ORDER — MECLIZINE HCL 12.5 MG PO TABS: 12.5000 mg | ORAL_TABLET | Freq: Three times a day (TID) | ORAL | 0 refills | Status: AC | PRN

## 2024-01-27 NOTE — ED Provider Notes (Signed)
 [place] EMERGENCY DEPARTMENT AT [facility] Provider Note   CSN: [id number] Arrival date & time: 01/27/24  9048     Patient presents with: Dizziness   Brian [name] is a 78 y.o. male.    Dizziness Patient feels her dizziness.  Feels lightheaded.  Feels that the bed was trying to throw him out.  Has had symptoms worse today.  Had episodes on and off for a while now however.  Does have some ringing in both his ears.  No numbness or weakness.  Has a history of macular degeneration and gets injections.  Also hard of hearing.  No chest pain.  No lateralizing numbness or weakness.     Prior to Admission medications   Medication Sig Start Date End Date Taking? Authorizing Provider  acetaminophen  (TYLENOL ) 500 MG tablet Take 500 mg by mouth every 6 (six) hours as needed.   Yes [provider]  amLODipine  (NORVASC ) 10 MG tablet TAKE 1 TABLET(10 MG) BY MOUTH DAILY 09/04/23  Yes Chandrasekhar, Mahesh A, MD  Ascorbic Acid  (VITAMIN C ) 1000 MG tablet Take 1,000 mg by mouth daily.   Yes [provider]  aspirin  EC 81 MG tablet Take 81 mg by mouth daily.   Yes [provider]  carvedilol  (COREG ) 6.25 MG tablet TAKE 1 TABLET(6.25 MG) BY MOUTH TWICE DAILY 06/26/23  Yes Chandrasekhar, Mahesh A, MD  cycloSPORINE  (RESTASIS ) 0.05 % ophthalmic emulsion Place 1 drop into both eyes 2 (two) times daily. 05/17/18  Yes Dwan, Donielle M, PA-C  FERROUS SULFATE PO Take 65 mg by mouth 2 (two) times a week.   Yes [provider]  furosemide  (LASIX ) 40 MG tablet Take 1 tablet (40 mg total) by mouth 2 (two) times daily. 06/26/23  Yes Chandrasekhar, Mahesh A, MD  JARDIANCE 10 MG TABS tablet Take 10 mg by mouth daily. 03/30/23  Yes [provider]  meclizine (ANTIVERT) 12.5 MG tablet Take 1 tablet (12.5 mg total) by mouth 3 (three) times daily as needed for dizziness. 01/27/24  Yes Patsey Lot, MD  Multiple Vitamins-Minerals (EQ VISION FORMULA 50+) CAPS Take 1  capsule by mouth daily.   Yes [provider]  sacubitril -valsartan  (ENTRESTO ) 49-51 MG Take 1 tablet by mouth 2 (two) times daily. 08/02/23  Yes Chandrasekhar, Mahesh A, MD  vitamin B-12 (CYANOCOBALAMIN) 1000 MCG tablet 1 tablet   Yes [provider]  vitamin E 1000 UNIT capsule Take 1,000 Units by mouth daily.   Yes [provider]  atorvastatin  (LIPITOR ) 80 MG tablet TAKE 1 TABLET(80 MG) BY MOUTH DAILY 06/26/23   Santo Kelly A, MD  sildenafil (REVATIO) 20 MG tablet as needed (ED). 07/13/20   [provider]    Allergies: Other, Percodan [oxycodone -aspirin ], and Lisinopril    Review of Systems  Neurological:  Positive for dizziness.    Updated Vital Signs BP (!) 116/90 (BP Location: Right Arm)   Pulse 81   Temp 97.6 F (36.4 C) (Oral)   Resp 16   Ht 5' 10 (1.778 m)   Wt 83.9 kg   SpO2 94%   BMI 26.54 kg/m   Physical Exam Vitals and nursing note reviewed.  HENT:     Head: Normocephalic.  Eyes:     Extraocular Movements: Extraocular movements intact.     Comments: Some mild nystagmus.  Slight difficulty tracking due to macular degeneration.  Neurological:     Mental Status: He is alert and oriented to person, place, and time.     Comments:  Awake and appropriate.  Somewhat unsteady with ambulation.  However negative Romberg.  Finger-nose intact although slightly more difficult on right side due to his vision changes.     (all labs ordered are listed, but only abnormal results are displayed) Labs Reviewed  BASIC METABOLIC PANEL WITH GFR - Abnormal; Notable for the following components:      Result Value   Glucose, Bld 105 (*)    All other components within normal limits  CBC    EKG: EKG Interpretation Date/Time:  Saturday January 27 2024 09:59:55 EDT Ventricular Rate:  63 PR Interval:  164 QRS Duration:  153 QT Interval:  450 QTC Calculation: 461 R Axis:   29  Text Interpretation: atrial sensed ventricular pacing  Multiple ventricular premature complexes Left bundle branch block Confirmed by Patsey Lot [phone]) on 01/27/2024 10:22:30 AM  Radiology: No results found.   Procedures   Medications Ordered in the ED - No data to display                                  Medical Decision Making Amount and/or Complexity of Data Reviewed Labs: ordered.   Patient dizziness.  Appears more vertiginous.  However does have a pacemaker.  Exam slightly more difficult due to some vision changes in his eyes prickly right eye.  However just had some nystagmus.  With the ringing in the ears sounds more like peripheral vertigo.  Not a good candidate for MRI at this time both with not having availability here and with his pacemaker which would require more resources.  Will get blood work and interrogate pacemaker for now.  Have reviewed cardiology notes.   Pacemaker interrogation reassuring.  Blood work reassuring.  Feeling somewhat better.  Think this is likely peripheral vertigo.  Do not think we need MRI at this time will have follow-up with PCP since the ENT on-call does not see vertigo.  Will discharge home.  Will treat symptomatically.     Final diagnoses:  Peripheral vertigo, unspecified laterality    ED Discharge Orders          Ordered    meclizine (ANTIVERT) 12.5 MG tablet  3 times daily PRN        01/27/24 1132               Patsey Lot, MD 01/27/24 1448

## 2024-01-27 NOTE — ED Triage Notes (Signed)
 Dizziness that he woke up with this morning. He felt lightheaded last night before bed. Ringing in bilateral ears. Denies nausea, vomiting, slurred speech, or visual disturbances. He has macular degeneration that he receives injections in left eye. He has similar episodes of shorter duration. He ate food and took his medication this morning.

## 2024-02-15 DIAGNOSIS — Z961 Presence of intraocular lens: Secondary | ICD-10-CM | POA: Diagnosis not present

## 2024-02-15 DIAGNOSIS — H43813 Vitreous degeneration, bilateral: Secondary | ICD-10-CM | POA: Diagnosis not present

## 2024-02-15 DIAGNOSIS — H353134 Nonexudative age-related macular degeneration, bilateral, advanced atrophic with subfoveal involvement: Secondary | ICD-10-CM | POA: Diagnosis not present

## 2024-02-15 DIAGNOSIS — D3131 Benign neoplasm of right choroid: Secondary | ICD-10-CM | POA: Diagnosis not present

## 2024-03-06 ENCOUNTER — Encounter: Payer: Self-pay | Admitting: Internal Medicine

## 2024-03-13 ENCOUNTER — Ambulatory Visit: Payer: Medicare Other

## 2024-03-13 DIAGNOSIS — I442 Atrioventricular block, complete: Secondary | ICD-10-CM | POA: Diagnosis not present

## 2024-03-14 LAB — CUP PACEART REMOTE DEVICE CHECK
Battery Remaining Longevity: 25 mo
Battery Voltage: 2.9 V
Brady Statistic AP VP Percent: 23.58 %
Brady Statistic AP VS Percent: 0.05 %
Brady Statistic AS VP Percent: 70.89 %
Brady Statistic AS VS Percent: 5.47 %
Brady Statistic RA Percent Paced: 26.72 %
Brady Statistic RV Percent Paced: 94.47 %
Date Time Interrogation Session: 20251217003912
Implantable Lead Connection Status: 753985
Implantable Lead Connection Status: 753985
Implantable Lead Connection Status: 753985
Implantable Lead Connection Status: 753985
Implantable Lead Implant Date: 20190614
Implantable Lead Implant Date: 20190614
Implantable Lead Implant Date: 20200213
Implantable Lead Implant Date: 20200213
Implantable Lead Location: 753858
Implantable Lead Location: 753858
Implantable Lead Location: 753859
Implantable Lead Location: 753860
Implantable Lead Model: 3830
Implantable Lead Model: 5071
Implantable Lead Model: 5071
Implantable Lead Model: 5076
Implantable Pulse Generator Implant Date: 20190614
Lead Channel Impedance Value: 304 Ohm
Lead Channel Impedance Value: 304 Ohm
Lead Channel Impedance Value: 418 Ohm
Lead Channel Impedance Value: 475 Ohm
Lead Channel Pacing Threshold Amplitude: 0.75 V
Lead Channel Pacing Threshold Amplitude: 1 V
Lead Channel Pacing Threshold Pulse Width: 0.4 ms
Lead Channel Pacing Threshold Pulse Width: 0.4 ms
Lead Channel Sensing Intrinsic Amplitude: 4.625 mV
Lead Channel Sensing Intrinsic Amplitude: 4.625 mV
Lead Channel Sensing Intrinsic Amplitude: 6.25 mV
Lead Channel Sensing Intrinsic Amplitude: 6.25 mV
Lead Channel Setting Pacing Amplitude: 1.5 V
Lead Channel Setting Pacing Amplitude: 2.5 V
Lead Channel Setting Pacing Pulse Width: 0.6 ms
Lead Channel Setting Sensing Sensitivity: 2 mV
Zone Setting Status: 755011
Zone Setting Status: 755011

## 2024-03-14 NOTE — Progress Notes (Signed)
 Remote PPM Transmission

## 2024-03-20 ENCOUNTER — Other Ambulatory Visit: Payer: Self-pay | Admitting: Internal Medicine

## 2024-03-24 ENCOUNTER — Ambulatory Visit: Payer: Self-pay | Admitting: Internal Medicine

## 2024-05-21 ENCOUNTER — Ambulatory Visit: Admitting: Physician Assistant
